# Patient Record
Sex: Male | Born: 1970 | Race: White | Hispanic: No | Marital: Married | Smoking: Never smoker
Health system: Southern US, Community
[De-identification: ages and names within clinical notes are randomized; demographics above are authoritative.]

## PROBLEM LIST (undated history)

## (undated) DIAGNOSIS — B009 Herpesviral infection, unspecified: Secondary | ICD-10-CM

## (undated) DIAGNOSIS — F419 Anxiety disorder, unspecified: Secondary | ICD-10-CM

## (undated) DIAGNOSIS — F32A Depression, unspecified: Secondary | ICD-10-CM

## (undated) DIAGNOSIS — T7840XA Allergy, unspecified, initial encounter: Secondary | ICD-10-CM

## (undated) DIAGNOSIS — F329 Major depressive disorder, single episode, unspecified: Secondary | ICD-10-CM

## (undated) HISTORY — DX: Herpesviral infection, unspecified: B00.9

## (undated) HISTORY — PX: NASAL SINUS SURGERY: SHX719

## (undated) HISTORY — DX: Major depressive disorder, single episode, unspecified: F32.9

## (undated) HISTORY — DX: Allergy, unspecified, initial encounter: T78.40XA

## (undated) HISTORY — DX: Depression, unspecified: F32.A

## (undated) HISTORY — DX: Anxiety disorder, unspecified: F41.9

## (undated) HISTORY — PX: NOSE SURGERY: SHX723

## (undated) HISTORY — PX: HERNIA REPAIR: SHX51

---

## 1998-05-26 HISTORY — PX: VASECTOMY: SHX75

## 1999-09-26 ENCOUNTER — Encounter: Payer: Self-pay | Admitting: Emergency Medicine

## 1999-09-26 ENCOUNTER — Emergency Department (HOSPITAL_COMMUNITY): Admission: EM | Admit: 1999-09-26 | Discharge: 1999-09-26 | Payer: Self-pay | Admitting: Emergency Medicine

## 1999-09-28 ENCOUNTER — Emergency Department (HOSPITAL_COMMUNITY): Admission: EM | Admit: 1999-09-28 | Discharge: 1999-09-28 | Payer: Self-pay | Admitting: Emergency Medicine

## 1999-09-30 ENCOUNTER — Ambulatory Visit (HOSPITAL_COMMUNITY): Admission: RE | Admit: 1999-09-30 | Discharge: 1999-09-30 | Payer: Self-pay | Admitting: Plastic Surgery

## 2011-08-04 ENCOUNTER — Other Ambulatory Visit: Payer: Self-pay | Admitting: Physician Assistant

## 2011-09-02 ENCOUNTER — Other Ambulatory Visit: Payer: Self-pay | Admitting: Physician Assistant

## 2011-09-16 ENCOUNTER — Encounter: Payer: Self-pay | Admitting: Physician Assistant

## 2011-09-16 ENCOUNTER — Ambulatory Visit (INDEPENDENT_AMBULATORY_CARE_PROVIDER_SITE_OTHER): Payer: BC Managed Care – PPO | Admitting: Physician Assistant

## 2011-09-16 VITALS — BP 132/89 | HR 66 | Temp 97.0°F | Resp 18 | Ht 73.0 in | Wt 225.0 lb

## 2011-09-16 DIAGNOSIS — F32A Depression, unspecified: Secondary | ICD-10-CM

## 2011-09-16 DIAGNOSIS — F419 Anxiety disorder, unspecified: Secondary | ICD-10-CM | POA: Insufficient documentation

## 2011-09-16 DIAGNOSIS — Z0000 Encounter for general adult medical examination without abnormal findings: Secondary | ICD-10-CM

## 2011-09-16 DIAGNOSIS — Z125 Encounter for screening for malignant neoplasm of prostate: Secondary | ICD-10-CM

## 2011-09-16 DIAGNOSIS — F329 Major depressive disorder, single episode, unspecified: Secondary | ICD-10-CM | POA: Insufficient documentation

## 2011-09-16 DIAGNOSIS — Z1211 Encounter for screening for malignant neoplasm of colon: Secondary | ICD-10-CM

## 2011-09-16 DIAGNOSIS — F341 Dysthymic disorder: Secondary | ICD-10-CM

## 2011-09-16 DIAGNOSIS — B009 Herpesviral infection, unspecified: Secondary | ICD-10-CM

## 2011-09-16 LAB — CBC WITH DIFFERENTIAL/PLATELET
Basophils Absolute: 0.1 10*3/uL (ref 0.0–0.1)
Basophils Relative: 1 % (ref 0–1)
Eosinophils Absolute: 0.1 10*3/uL (ref 0.0–0.7)
Eosinophils Relative: 2 % (ref 0–5)
HCT: 42.3 % (ref 39.0–52.0)
Hemoglobin: 15.4 g/dL (ref 13.0–17.0)
Lymphocytes Relative: 28 % (ref 12–46)
Lymphs Abs: 2.1 10*3/uL (ref 0.7–4.0)
MCH: 31.2 pg (ref 26.0–34.0)
MCHC: 36.4 g/dL — ABNORMAL HIGH (ref 30.0–36.0)
MCV: 85.8 fL (ref 78.0–100.0)
Monocytes Absolute: 0.5 10*3/uL (ref 0.1–1.0)
Monocytes Relative: 6 % (ref 3–12)
Neutro Abs: 4.8 10*3/uL (ref 1.7–7.7)
Neutrophils Relative %: 63 % (ref 43–77)
Platelets: 164 10*3/uL (ref 150–400)
RBC: 4.93 MIL/uL (ref 4.22–5.81)
RDW: 14.5 % (ref 11.5–15.5)
WBC: 7.6 10*3/uL (ref 4.0–10.5)

## 2011-09-16 LAB — COMPREHENSIVE METABOLIC PANEL
AST: 21 U/L (ref 0–37)
Albumin: 4.8 g/dL (ref 3.5–5.2)
Alkaline Phosphatase: 51 U/L (ref 39–117)
BUN: 13 mg/dL (ref 6–23)
Calcium: 9.4 mg/dL (ref 8.4–10.5)
Chloride: 103 mEq/L (ref 96–112)
Potassium: 4.1 mEq/L (ref 3.5–5.3)
Sodium: 139 mEq/L (ref 135–145)
Total Protein: 7.2 g/dL (ref 6.0–8.3)

## 2011-09-16 LAB — POCT URINALYSIS DIPSTICK
Glucose, UA: NEGATIVE
Leukocytes, UA: NEGATIVE
Nitrite, UA: NEGATIVE
Urobilinogen, UA: 1

## 2011-09-16 LAB — POCT UA - MICROSCOPIC ONLY
Casts, Ur, LPF, POC: NEGATIVE
Crystals, Ur, HPF, POC: NEGATIVE
Yeast, UA: NEGATIVE

## 2011-09-16 LAB — LIPID PANEL
Cholesterol: 129 mg/dL (ref 0–200)
HDL: 32 mg/dL — ABNORMAL LOW (ref >39)
LDL Cholesterol: 75 mg/dL (ref 0–99)
Total CHOL/HDL Ratio: 4 Ratio
Triglycerides: 111 mg/dL (ref <150)
VLDL: 22 mg/dL (ref 0–40)

## 2011-09-16 LAB — TSH: TSH: 3.916 u[IU]/mL (ref 0.350–4.500)

## 2011-09-16 LAB — PSA: PSA: 0.48 ng/mL (ref <=4.00)

## 2011-09-16 LAB — IFOBT (OCCULT BLOOD): IFOBT: NEGATIVE

## 2011-09-16 MED ORDER — VALACYCLOVIR HCL 500 MG PO TABS: 500.0000 mg | ORAL_TABLET | Freq: Every day | ORAL | Status: DC

## 2011-09-16 MED ORDER — CITALOPRAM HYDROBROMIDE 20 MG PO TABS: 20.0000 mg | ORAL_TABLET | Freq: Every day | ORAL | Status: DC

## 2011-09-16 NOTE — Progress Notes (Signed)
Subjective:    Patient ID: Socorro Ebron, male    DOB: 26-Jun-1970, 41 y.o.   MRN: [id number]  HPI  Presents for CPE.  Feels well, without concerns.  Review of Systems  Constitutional: Negative.   HENT: Negative.   Eyes: Negative.   Respiratory: Negative.   Cardiovascular: Negative.   Gastrointestinal: Negative.   Genitourinary: Negative.   Musculoskeletal: Negative.   Skin: Negative.   Neurological: Negative.   Hematological: Negative.   Psychiatric/Behavioral: Negative.        Objective:   Physical Exam  Constitutional: He is oriented to person, place, and time. Vital signs are normal. He appears well-developed and well-nourished.  Non-toxic appearance. He does not have a sickly appearance. He does not appear ill. No distress.  HENT:  Head: Normocephalic and atraumatic. No trismus in the jaw.  Right Ear: Hearing, tympanic membrane, external ear and ear canal normal.  Left Ear: Hearing, tympanic membrane, external ear and ear canal normal.  Nose: Nose normal.  Mouth/Throat: Uvula is midline, oropharynx is clear and moist and mucous membranes are normal. He does not have dentures. No oral lesions. Normal dentition. No dental abscesses, uvula swelling, lacerations or dental caries.  Eyes: Conjunctivae and EOM are normal. Pupils are equal, round, and reactive to light. Right eye exhibits no discharge. Left eye exhibits no discharge. No scleral icterus.  Fundoscopic exam:      The right eye shows no arteriolar narrowing, no AV nicking, no exudate, no hemorrhage and no papilledema. The right eye shows red reflex.The right eye shows no venous pulsations.      The left eye shows no arteriolar narrowing, no AV nicking, no exudate, no hemorrhage and no papilledema. The left eye shows red reflex.The left eye shows no venous pulsations. Neck: Normal range of motion, full passive range of motion without pain and phonation normal. Neck supple. No spinous process tenderness and no muscular  tenderness present. No rigidity. No tracheal deviation, no edema, no erythema and normal range of motion present. No thyromegaly present.  Cardiovascular: Normal rate, regular rhythm, S1 normal, S2 normal, normal heart sounds, intact distal pulses and normal pulses.  Exam reveals no gallop and no friction rub.   No murmur heard. Pulmonary/Chest: Effort normal and breath sounds normal. No respiratory distress. He has no wheezes. He has no rales.  Abdominal: Soft. Normal appearance and bowel sounds are normal. He exhibits no distension and no mass. There is no hepatosplenomegaly. There is no tenderness. There is no rebound and no guarding. No hernia. Hernia confirmed negative in the right inguinal area and confirmed negative in the left inguinal area.  Genitourinary: Prostate normal, testes normal and penis normal. Rectal exam shows external hemorrhoid. Rectal exam shows no internal hemorrhoid, no fissure, no mass, no tenderness and anal tone normal. Guaiac negative stool. Circumcised. No phimosis, paraphimosis, hypospadias, penile erythema or penile tenderness. No discharge found.  Musculoskeletal: Normal range of motion. He exhibits no edema and no tenderness.       Right shoulder: Normal.       Left shoulder: Normal.       Right elbow: Normal.      Left elbow: Normal.       Right wrist: Normal.       Left wrist: Normal.       Right hip: Normal.       Left hip: Normal.       Right knee: Normal.       Left knee: Normal.  Right ankle: Normal. Achilles tendon normal.       Left ankle: Normal. Achilles tendon normal.       Cervical back: Normal. He exhibits normal range of motion, no tenderness, no bony tenderness, no swelling, no edema, no deformity, no laceration, no pain, no spasm and normal pulse.       Thoracic back: Normal.       Lumbar back: Normal.       Right upper arm: Normal.       Left upper arm: Normal.       Right forearm: Normal.       Left forearm: Normal.       Right hand:  Normal.       Left hand: Normal.       Right upper leg: Normal.       Left upper leg: Normal.       Right lower leg: Normal.       Left lower leg: Normal.       Right foot: Normal.       Left foot: Normal.  Lymphadenopathy:       Head (right side): No submental, no submandibular, no tonsillar, no preauricular, no posterior auricular and no occipital adenopathy present.       Head (left side): No submental, no submandibular, no tonsillar, no preauricular, no posterior auricular and no occipital adenopathy present.    He has no cervical adenopathy.       Right: No inguinal and no supraclavicular adenopathy present.       Left: No inguinal and no supraclavicular adenopathy present.  Neurological: He is alert and oriented to person, place, and time. He has normal strength and normal reflexes. He displays no tremor. No cranial nerve deficit. He exhibits normal muscle tone. Coordination and gait normal.  Skin: Skin is warm, dry and intact. No abrasion, no ecchymosis, no laceration, no lesion and no rash noted. He is not diaphoretic. No cyanosis or erythema. No pallor. Nails show no clubbing.  Psychiatric: He has a normal mood and affect. His speech is normal and behavior is normal. Judgment and thought content normal. Cognition and memory are normal.    Results for orders placed in visit on 09/16/11  POCT UA - MICROSCOPIC ONLY      Component Value Range   WBC, Ur, HPF, POC neg     RBC, urine, microscopic 0-1     Bacteria, U Microscopic neg     Mucus, UA neg     Epithelial cells, urine per micros neg     Crystals, Ur, HPF, POC neg     Casts, Ur, LPF, POC neg     Yeast, UA neg    POCT URINALYSIS DIPSTICK      Component Value Range   Color, UA yellow     Clarity, UA clear     Glucose, UA neg     Bilirubin, UA neg     Ketones, UA neg     Spec Grav, UA 1.020     Blood, UA neg     pH, UA 7.5     Protein, UA trace     Urobilinogen, UA 1.0     Nitrite, UA neg     Leukocytes, UA Negative     IFOBT (OCCULT BLOOD)      Component Value Range   IFOBT Negative           Assessment & Plan:   1. Routine general medical examination at a health  care facility  CBC with Differential, TSH, Lipid panel, Comprehensive metabolic panel, POCT UA - Microscopic Only, POCT urinalysis dipstick  2. Anxiety and depression  citalopram (CELEXA) 20 MG tablet  3. HSV-2 infection  valACYclovir (VALTREX) 500 MG tablet  4. Screening for prostate cancer  PSA  5. Screening for colon cancer  IFOBT POC (occult bld, rslt in office)

## 2011-09-16 NOTE — Patient Instructions (Signed)
Keeping you healthy  Get these tests  Blood pressure- Have your blood pressure checked once a year by your healthcare provider.  Normal blood pressure is 120/80.  Weight- Have your body mass index (BMI) calculated to screen for obesity.  BMI is a measure of body fat based on height and weight. You can also calculate your own BMI at [email or web address].west-esparza.com/.  Cholesterol- Have your cholesterol checked regularly starting at age 42, sooner may be necessary if you have diabetes, high blood pressure, if a family member developed heart diseases at an early age or if you smoke.   Chlamydia, HIV, and other sexual transmitted disease- Get screened each year until the age of 29 then within three months of each new sexual partner.  Diabetes- Have your blood sugar checked regularly if you have high blood pressure, high cholesterol, a family history of diabetes or if you are overweight.  Get these vaccines  Flu shot- Every fall.  Tetanus shot- Every 10 years.  Menactra- Single dose; prevents meningitis.  Take these steps  Don't smoke- If you do smoke, ask your healthcare provider about quitting. For tips on how to quit, go to www.smokefree.gov or call 1-800-QUIT-NOW.  Be physically active- Exercise 5 days a week for at least 30 minutes.  If you are not already physically active start slow and gradually work up to 30 minutes of moderate physical activity.  Examples of moderate activity include walking briskly, mowing the yard, dancing, swimming bicycling, etc.  Eat a healthy diet- Eat a variety of healthy foods such as fruits, vegetables, low fat milk, low fat cheese, yogurt, lean meats, poultry, fish, beans, tofu, etc.  For more information on healthy eating, go to www.thenutritionsource.org  Drink alcohol in moderation- Limit alcohol intake two drinks or less a day.  Never drink and drive.  Dentist- Brush and floss teeth twice daily; visit your dentis twice a year.  Depression-Your emotional  health is as important as your physical health.  If you're feeling down, losing interest in things you normally enjoy please talk with your healthcare provider.  Gun Safety- If you keep a gun in your home, keep it unloaded and with the safety lock on.  Bullets should be stored separately.  Helmet use- Always wear a helmet when riding a motorcycle, bicycle, rollerblading or skateboarding.  Safe sex- If you may be exposed to a sexually transmitted infection, use a condom  Seat belts- Seat bels can save your life; always wear one.  Smoke/Carbon Monoxide detectors- These detectors need to be installed on the appropriate level of your home.  Replace batteries at least once a year.  Skin Cancer- When out in the sun, cover up and use sunscreen SPF 15 or higher.  Violence- If anyone is threatening or hurting you, please tell your healthcare provider.  If you have not heard from me about your lab results in 2 weeks, please call.

## 2011-09-17 ENCOUNTER — Encounter: Payer: Self-pay | Admitting: Physician Assistant

## 2012-09-14 ENCOUNTER — Ambulatory Visit (INDEPENDENT_AMBULATORY_CARE_PROVIDER_SITE_OTHER): Payer: BC Managed Care – PPO | Admitting: Physician Assistant

## 2012-09-14 ENCOUNTER — Encounter: Payer: Self-pay | Admitting: Physician Assistant

## 2012-09-14 VITALS — BP 122/84 | HR 67 | Temp 97.8°F | Resp 16 | Ht 72.05 in | Wt 224.0 lb

## 2012-09-14 DIAGNOSIS — F329 Major depressive disorder, single episode, unspecified: Secondary | ICD-10-CM

## 2012-09-14 DIAGNOSIS — Z125 Encounter for screening for malignant neoplasm of prostate: Secondary | ICD-10-CM

## 2012-09-14 DIAGNOSIS — F341 Dysthymic disorder: Secondary | ICD-10-CM

## 2012-09-14 DIAGNOSIS — F32A Depression, unspecified: Secondary | ICD-10-CM

## 2012-09-14 DIAGNOSIS — G2581 Restless legs syndrome: Secondary | ICD-10-CM

## 2012-09-14 DIAGNOSIS — Z0000 Encounter for general adult medical examination without abnormal findings: Secondary | ICD-10-CM

## 2012-09-14 DIAGNOSIS — B009 Herpesviral infection, unspecified: Secondary | ICD-10-CM

## 2012-09-14 DIAGNOSIS — Z1211 Encounter for screening for malignant neoplasm of colon: Secondary | ICD-10-CM

## 2012-09-14 LAB — POCT URINALYSIS DIPSTICK
Glucose, UA: NEGATIVE
Ketones, UA: NEGATIVE
Leukocytes, UA: NEGATIVE
Urobilinogen, UA: 0.2

## 2012-09-14 LAB — CBC WITH DIFFERENTIAL/PLATELET
Basophils Absolute: 0.1 10*3/uL (ref 0.0–0.1)
Basophils Relative: 1 % (ref 0–1)
HCT: 42.4 % (ref 39.0–52.0)
MCHC: 35.6 g/dL (ref 30.0–36.0)
Monocytes Absolute: 0.5 10*3/uL (ref 0.1–1.0)
Neutro Abs: 4.5 10*3/uL (ref 1.7–7.7)
Platelets: 153 10*3/uL (ref 150–400)
RDW: 14.3 % (ref 11.5–15.5)
WBC: 7.4 10*3/uL (ref 4.0–10.5)

## 2012-09-14 LAB — COMPREHENSIVE METABOLIC PANEL
ALT: 44 U/L (ref 0–53)
AST: 31 U/L (ref 0–37)
Albumin: 4.4 g/dL (ref 3.5–5.2)
Calcium: 9.3 mg/dL (ref 8.4–10.5)
Chloride: 102 mEq/L (ref 96–112)
Potassium: 3.8 mEq/L (ref 3.5–5.3)
Sodium: 140 mEq/L (ref 135–145)
Total Protein: 7.1 g/dL (ref 6.0–8.3)

## 2012-09-14 LAB — POCT UA - MICROSCOPIC ONLY
Casts, Ur, LPF, POC: NEGATIVE
Crystals, Ur, HPF, POC: NEGATIVE

## 2012-09-14 LAB — PSA: PSA: 0.54 ng/mL (ref <=4.00)

## 2012-09-14 MED ORDER — VALACYCLOVIR HCL 500 MG PO TABS: 500.0000 mg | ORAL_TABLET | Freq: Every day | ORAL | Status: DC

## 2012-09-14 MED ORDER — CITALOPRAM HYDROBROMIDE 20 MG PO TABS: 20.0000 mg | ORAL_TABLET | Freq: Every day | ORAL | Status: DC

## 2012-09-14 MED ORDER — ROPINIROLE HCL 0.5 MG PO TABS: ORAL_TABLET | ORAL | Status: DC

## 2012-09-14 NOTE — Progress Notes (Deleted)
  Subjective:    Patient ID: Kevin West, male    DOB: 28-Jan-1971, 43 y.o.   MRN: [id number]  HPI    Review of Systems  Constitutional: Negative.   HENT: Negative.   Eyes: Negative.   Respiratory: Negative.   Cardiovascular: Negative.   Gastrointestinal: Negative.   Endocrine: Negative.   Genitourinary: Negative.   Musculoskeletal: Negative.   Skin: Negative.   Allergic/Immunologic: Negative.   Neurological: Negative.   Hematological: Negative.   Psychiatric/Behavioral: Negative.        Objective:   Physical Exam        Assessment & Plan:

## 2012-09-14 NOTE — Progress Notes (Signed)
Subjective:    Patient ID: Kevin West, male    DOB: February 21, 1971, 42 y.o.   MRN: [id number]  HPI  This 42 y.o. male presents for Annual Wellness Exam.   Past Medical History  Diagnosis Date  . Depression   . Anxiety   . HSV-2 infection   . Allergy     Past Surgical History  Procedure Laterality Date  . Hernia repair    . Vasectomy  2000  . Nose surgery      fracture  . Nasal sinus surgery      Prior to Admission medications   Medication Sig Start Date End Date Taking? Authorizing Provider  citalopram (CELEXA) 20 MG tablet Take 1 tablet (20 mg total) by mouth daily. 09/14/12  Yes [name] S [name], PA-C  valACYclovir (VALTREX) 500 MG tablet Take 1 tablet (500 mg total) by mouth daily. 09/14/12  Yes [name] S [name], PA-C    No Known Allergies  History   Social History  . Marital Status: Married    Spouse Name: N/A    Number of Children: 2  . Years of Education: 12   Occupational History  . construction/contractor   . baseball coach    Social History Main Topics  . Smoking status: Never Smoker   . Smokeless tobacco: Not on file  . Alcohol Use: No  . Drug Use: No  . Sexually Active: Yes -- Male partner(s)   Other Topics Concern  . Not on file   Social History Narrative   Lives with his wife (married 07/22/1990) and their 2 sons. Does not exercise.      Family History  Problem Relation Age of Onset  . Hypertension Mother   . Stroke Mother 2  . Cancer Sister   . Cancer Maternal Grandfather   . Cancer Sister     skin     Review of Systems  Constitutional: Negative.        Work has been very busy for the past 5-6 months.  Has recently cut back on his evening hours, but has been working 18-hour days.  HENT: Negative.   Eyes: Negative.   Respiratory: Negative.   Cardiovascular: Negative.   Gastrointestinal: Negative.   Endocrine: Negative.   Genitourinary: Negative.   Skin: Negative.   Allergic/Immunologic: Negative.   Neurological:       Leg  pain and restlessness at night x 6-8 months.  If he tucks his legs under his wife's legs, it seems to help.  Hematological: Negative.   Psychiatric/Behavioral: Negative.        Objective:   Physical Exam  Vitals reviewed. Constitutional: He is oriented to person, place, and time. Vital signs are normal. He appears well-developed and well-nourished. He is active and cooperative. No distress.  HENT:  Head: Normocephalic and atraumatic.  Right Ear: Hearing, tympanic membrane, external ear and ear canal normal.  Left Ear: Hearing, tympanic membrane, external ear and ear canal normal.  Nose: Nose normal.  Mouth/Throat: Uvula is midline, oropharynx is clear and moist and mucous membranes are normal. Normal dentition. No oropharyngeal exudate.  Eyes: Conjunctivae, EOM and lids are normal. Pupils are equal, round, and reactive to light. Right eye exhibits no discharge. Left eye exhibits no discharge. No scleral icterus.  Fundoscopic exam:      The right eye shows no arteriolar narrowing, no AV nicking, no exudate, no hemorrhage and no papilledema. The right eye shows red reflex.       The left eye shows no  arteriolar narrowing, no AV nicking, no exudate, no hemorrhage and no papilledema. The left eye shows red reflex.  Neck: Normal range of motion and full passive range of motion without pain. Neck supple. No thyromegaly present.  Cardiovascular: Normal rate, regular rhythm, normal heart sounds and intact distal pulses.   Pulmonary/Chest: Effort normal and breath sounds normal.  Abdominal: Soft. Normal appearance and bowel sounds are normal. There is no tenderness. There is no CVA tenderness. No hernia. Hernia confirmed negative in the right inguinal area and confirmed negative in the left inguinal area.  Genitourinary: Rectum normal, prostate normal, testes normal and penis normal. Circumcised.  Musculoskeletal: Normal range of motion.       Right ankle: Normal. Achilles tendon normal.       Left  ankle: Normal.       Cervical back: Normal.       Thoracic back: Normal.       Lumbar back: Normal.       Right upper leg: Normal.       Left upper leg: Normal.       Right lower leg: Normal.       Left lower leg: Normal.       Right foot: Normal.       Left foot: Normal.  Lymphadenopathy:    He has no cervical adenopathy.       Right: No inguinal and no supraclavicular adenopathy present.       Left: No inguinal and no supraclavicular adenopathy present.  Neurological: He is alert and oriented to person, place, and time. He has normal strength and normal reflexes. No cranial nerve deficit or sensory deficit. Coordination normal.  Skin: Skin is warm, dry and intact. No rash noted. He is not diaphoretic.  Psychiatric: He has a normal mood and affect. His speech is normal and behavior is normal. Judgment and thought content normal.   Results for orders placed in visit on 09/14/12  POCT UA - MICROSCOPIC ONLY      Result Value Range   WBC, Ur, HPF, POC neg     RBC, urine, microscopic 5-10     Bacteria, U Microscopic neg     Mucus, UA neg     Epithelial cells, urine per micros neg     Crystals, Ur, HPF, POC neg     Casts, Ur, LPF, POC neg     Yeast, UA neg    POCT URINALYSIS DIPSTICK      Result Value Range   Color, UA orange     Clarity, UA clear     Glucose, UA neg     Bilirubin, UA neg     Ketones, UA neg     Spec Grav, UA >=1.030     Blood, UA trace-lysed     pH, UA 6.0     Protein, UA trace     Urobilinogen, UA 0.2     Nitrite, UA neg     Leukocytes, UA Negative    IFOBT (OCCULT BLOOD)      Result Value Range   IFOBT Negative          Assessment & Plan:  Routine general medical examination at a health care facility - Plan: CBC with Differential, POCT UA - Microscopic Only, POCT urinalysis dipstick, Lipid panel, Comprehensive metabolic panel, TSH; Age appropriate anticipatory guidance provided.  Screening for prostate cancer - Plan: PSA  Screening for colon  cancer - Plan: IFOBT POC (occult bld, rslt in office)  Anxiety and  depression - Plan: citalopram (CELEXA) 20 MG tablet  HSV-2 infection - Plan: valACYclovir (VALTREX) 500 MG tablet  Restless legs - Plan: rOPINIRole (REQUIP) 0.5 MG tablet  Fernande Bras, PA-C Physician Assistant-Certified [facility]

## 2012-09-14 NOTE — Patient Instructions (Addendum)
For Restless Legs: Start the requip by taking 1/2 tab daily for days 1&2, then 1 tab daily days 3-7.  You may increase by 1 tablet each week, up to a maximum for 3 mg (6 tabs) daily.  Contact me with the dose that resolves your symptoms, and I'll re-write your prescription for that dose.  I will contact you with your lab results as soon as they are available.  If you have not heard from me in 2 weeks, please contact me.  The fastest way to get your results is to register for My Chart (see the instructions on the last page of this printout).   Keeping you healthy  Get these tests  Blood pressure- Have your blood pressure checked once a year by your healthcare provider.  Normal blood pressure is 120/80.  Weight- Have your body mass index (BMI) calculated to screen for obesity.  BMI is a measure of body fat based on height and weight. You can also calculate your own BMI at https://www.west-esparza.com/.  Cholesterol- Have your cholesterol checked regularly starting at age 63, sooner may be necessary if you have diabetes, high blood pressure, if a family member developed heart diseases at an early age or if you smoke.   Chlamydia, HIV, and other sexual transmitted disease- Get screened each year until the age of 48 then within three months of each new sexual partner.  Diabetes- Have your blood sugar checked regularly if you have high blood pressure, high cholesterol, a family history of diabetes or if you are overweight.  Get these vaccines  Flu shot- Every fall.  Tetanus shot- Every 10 years.  Menactra- Single dose; prevents meningitis.  Take these steps  Don't smoke- If you do smoke, ask your healthcare provider about quitting. For tips on how to quit, go to www.smokefree.gov or call 1-800-QUIT-NOW.  Be physically active- Exercise 5 days a week for at least 30 minutes.  If you are not already physically active start slow and gradually work up to 30 minutes of moderate physical activity.   Examples of moderate activity include walking briskly, mowing the yard, dancing, swimming bicycling, etc.  Eat a healthy diet- Eat a variety of healthy foods such as fruits, vegetables, low fat milk, low fat cheese, yogurt, lean meats, poultry, fish, beans, tofu, etc.  For more information on healthy eating, go to www.thenutritionsource.org  Drink alcohol in moderation- Limit alcohol intake two drinks or less a day.  Never drink and drive.  Dentist- Brush and floss teeth twice daily; visit your dentis twice a year.  Depression-Your emotional health is as important as your physical health.  If you're feeling down, losing interest in things you normally enjoy please talk with your healthcare provider.  Gun Safety- If you keep a gun in your home, keep it unloaded and with the safety lock on.  Bullets should be stored separately.  Helmet use- Always wear a helmet when riding a motorcycle, bicycle, rollerblading or skateboarding.  Safe sex- If you may be exposed to a sexually transmitted infection, use a condom  Seat belts- Seat bels can save your life; always wear one.  Smoke/Carbon Monoxide detectors- These detectors need to be installed on the appropriate level of your home.  Replace batteries at least once a year.  Skin Cancer- When out in the sun, cover up and use sunscreen SPF 15 or higher.  Violence- If anyone is threatening or hurting you, please tell your healthcare provider.

## 2012-09-15 ENCOUNTER — Encounter: Payer: Self-pay | Admitting: Physician Assistant

## 2013-08-24 ENCOUNTER — Other Ambulatory Visit: Payer: Self-pay | Admitting: Physician Assistant

## 2013-09-27 ENCOUNTER — Encounter: Payer: BC Managed Care – PPO | Admitting: Physician Assistant

## 2013-09-29 ENCOUNTER — Ambulatory Visit (INDEPENDENT_AMBULATORY_CARE_PROVIDER_SITE_OTHER): Payer: BC Managed Care – PPO | Admitting: Physician Assistant

## 2013-09-29 ENCOUNTER — Encounter: Payer: Self-pay | Admitting: Physician Assistant

## 2013-09-29 VITALS — BP 127/82 | HR 77 | Temp 97.7°F | Resp 16 | Ht 72.5 in | Wt 231.0 lb

## 2013-09-29 DIAGNOSIS — F329 Major depressive disorder, single episode, unspecified: Secondary | ICD-10-CM

## 2013-09-29 DIAGNOSIS — L03319 Cellulitis of trunk, unspecified: Secondary | ICD-10-CM

## 2013-09-29 DIAGNOSIS — L02219 Cutaneous abscess of trunk, unspecified: Secondary | ICD-10-CM

## 2013-09-29 DIAGNOSIS — F32A Depression, unspecified: Secondary | ICD-10-CM

## 2013-09-29 DIAGNOSIS — F419 Anxiety disorder, unspecified: Secondary | ICD-10-CM

## 2013-09-29 DIAGNOSIS — Z125 Encounter for screening for malignant neoplasm of prostate: Secondary | ICD-10-CM

## 2013-09-29 DIAGNOSIS — F341 Dysthymic disorder: Secondary | ICD-10-CM

## 2013-09-29 DIAGNOSIS — Z0000 Encounter for general adult medical examination without abnormal findings: Secondary | ICD-10-CM

## 2013-09-29 DIAGNOSIS — B009 Herpesviral infection, unspecified: Secondary | ICD-10-CM

## 2013-09-29 DIAGNOSIS — G2581 Restless legs syndrome: Secondary | ICD-10-CM

## 2013-09-29 LAB — CBC WITH DIFFERENTIAL/PLATELET
BASOS ABS: 0.1 10*3/uL (ref 0.0–0.1)
BASOS PCT: 1 % (ref 0–1)
Eosinophils Absolute: 0.1 10*3/uL (ref 0.0–0.7)
Eosinophils Relative: 2 % (ref 0–5)
HEMATOCRIT: 41.9 % (ref 39.0–52.0)
HEMOGLOBIN: 15 g/dL (ref 13.0–17.0)
LYMPHS PCT: 27 % (ref 12–46)
Lymphs Abs: 1.9 10*3/uL (ref 0.7–4.0)
MCH: 31.7 pg (ref 26.0–34.0)
MCHC: 35.8 g/dL (ref 30.0–36.0)
MCV: 88.6 fL (ref 78.0–100.0)
MONO ABS: 0.4 10*3/uL (ref 0.1–1.0)
MONOS PCT: 6 % (ref 3–12)
NEUTROS ABS: 4.4 10*3/uL (ref 1.7–7.7)
NEUTROS PCT: 64 % (ref 43–77)
Platelets: 163 10*3/uL (ref 150–400)
RBC: 4.73 MIL/uL (ref 4.22–5.81)
RDW: 14.5 % (ref 11.5–15.5)
WBC: 6.9 10*3/uL (ref 4.0–10.5)

## 2013-09-29 LAB — POCT UA - MICROSCOPIC ONLY
Bacteria, U Microscopic: NEGATIVE
CASTS, UR, LPF, POC: NEGATIVE
CRYSTALS, UR, HPF, POC: NEGATIVE
EPITHELIAL CELLS, URINE PER MICROSCOPY: NEGATIVE
Mucus, UA: NEGATIVE
RBC, URINE, MICROSCOPIC: NEGATIVE
WBC, Ur, HPF, POC: NEGATIVE
Yeast, UA: NEGATIVE

## 2013-09-29 LAB — POCT URINALYSIS DIPSTICK
Bilirubin, UA: NEGATIVE
GLUCOSE UA: NEGATIVE
Ketones, UA: NEGATIVE
Leukocytes, UA: NEGATIVE
NITRITE UA: NEGATIVE
PH UA: 7
PROTEIN UA: NEGATIVE
RBC UA: NEGATIVE
SPEC GRAV UA: 1.02
UROBILINOGEN UA: 1

## 2013-09-29 LAB — COMPLETE METABOLIC PANEL WITH GFR
ALT: 33 U/L (ref 0–53)
AST: 26 U/L (ref 0–37)
Albumin: 4.4 g/dL (ref 3.5–5.2)
Alkaline Phosphatase: 48 U/L (ref 39–117)
BUN: 12 mg/dL (ref 6–23)
CALCIUM: 9.2 mg/dL (ref 8.4–10.5)
CHLORIDE: 102 meq/L (ref 96–112)
CO2: 29 meq/L (ref 19–32)
Creat: 1.21 mg/dL (ref 0.50–1.35)
GFR, EST AFRICAN AMERICAN: 84 mL/min
GFR, Est Non African American: 73 mL/min
Glucose, Bld: 91 mg/dL (ref 70–99)
POTASSIUM: 4.1 meq/L (ref 3.5–5.3)
SODIUM: 138 meq/L (ref 135–145)
TOTAL PROTEIN: 7.1 g/dL (ref 6.0–8.3)
Total Bilirubin: 1.3 mg/dL — ABNORMAL HIGH (ref 0.2–1.2)

## 2013-09-29 LAB — LIPID PANEL
CHOLESTEROL: 135 mg/dL (ref 0–200)
HDL: 30 mg/dL — ABNORMAL LOW (ref >39)
LDL Cholesterol: 67 mg/dL (ref 0–99)
TRIGLYCERIDES: 192 mg/dL — AB (ref <150)
Total CHOL/HDL Ratio: 4.5 Ratio
VLDL: 38 mg/dL (ref 0–40)

## 2013-09-29 MED ORDER — ROPINIROLE HCL 0.5 MG PO TABS: ORAL_TABLET | ORAL | Status: DC

## 2013-09-29 MED ORDER — CITALOPRAM HYDROBROMIDE 20 MG PO TABS: ORAL_TABLET | ORAL | Status: DC

## 2013-09-29 MED ORDER — VALACYCLOVIR HCL 500 MG PO TABS: ORAL_TABLET | ORAL | Status: DC

## 2013-09-29 MED ORDER — DOXYCYCLINE HYCLATE 100 MG PO CAPS: 100.0000 mg | ORAL_CAPSULE | Freq: Two times a day (BID) | ORAL | Status: DC

## 2013-09-29 NOTE — Patient Instructions (Addendum)
I will contact you with your lab results as soon as they are available.   If you have not heard from me in 2 weeks, please contact me.  The fastest way to get your results is to register for My Chart (see the instructions on the last page of this printout).  Keeping you healthy  Get these tests  Blood pressure- Have your blood pressure checked once a year by your healthcare provider.  Normal blood pressure is 120/80.  Weight- Have your body mass index (BMI) calculated to screen for obesity.  BMI is a measure of body fat based on height and weight. You can also calculate your own BMI at www.nhlbisupport.com/bmi/.  Cholesterol- Have your cholesterol checked regularly starting at age 35, sooner may be necessary if you have diabetes, high blood pressure, if a family member developed heart diseases at an early age or if you smoke.   Chlamydia, HIV, and other sexual transmitted disease- Get screened each year until the age of 25 then within three months of each new sexual partner.  Diabetes- Have your blood sugar checked regularly if you have high blood pressure, high cholesterol, a family history of diabetes or if you are overweight.  Get these vaccines  Flu shot- Every fall.  Tetanus shot- Every 10 years.  Menactra- Single dose; prevents meningitis.  Take these steps  Don't smoke- If you do smoke, ask your healthcare provider about quitting. For tips on how to quit, go to www.smokefree.gov or call 1-800-QUIT-NOW.  Be physically active- Exercise 5 days a week for at least 30 minutes.  If you are not already physically active start slow and gradually work up to 30 minutes of moderate physical activity.  Examples of moderate activity include walking briskly, mowing the yard, dancing, swimming bicycling, etc.  Eat a healthy diet- Eat a variety of healthy foods such as fruits, vegetables, low fat milk, low fat cheese, yogurt, lean meats, poultry, fish, beans, tofu, etc.  For more information  on healthy eating, go to www.thenutritionsource.org  Drink alcohol in moderation- Limit alcohol intake two drinks or less a day.  Never drink and drive.  Dentist- Brush and floss teeth twice daily; visit your dentis twice a year.  Depression-Your emotional health is as important as your physical health.  If you're feeling down, losing interest in things you normally enjoy please talk with your healthcare provider.  Gun Safety- If you keep a gun in your home, keep it unloaded and with the safety lock on.  Bullets should be stored separately.  Helmet use- Always wear a helmet when riding a motorcycle, bicycle, rollerblading or skateboarding.  Safe sex- If you may be exposed to a sexually transmitted infection, use a condom  Seat belts- Seat bels can save your life; always wear one.  Smoke/Carbon Monoxide detectors- These detectors need to be installed on the appropriate level of your home.  Replace batteries at least once a year.  Skin Cancer- When out in the sun, cover up and use sunscreen SPF 15 or higher.  Violence- If anyone is threatening or hurting you, please tell your healthcare provider. 

## 2013-09-29 NOTE — Progress Notes (Signed)
Subjective:    Patient ID: Kevin HaberDavid West, male    DOB: [birth year], 43 y.o.   MRN: [id number]  HPI  Patient Active Problem List   Diagnosis Date Noted  . 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 Anxiety and depression 09/16/2011   Prior to Admission medications   Medication Sig Start Date End Date Taking? Authorizing Provider  citalopram (CELEXA) 20 MG tablet TAKE ONE TABLET BY MOUTH ONCE DAILY 09/29/13  Yes Chelle S Jeffery, PA-C  valACYclovir (VALTREX) 500 MG tablet TAKE ONE TABLET BY MOUTH ONCE DAILY 09/29/13  Yes Chelle S Jeffery, PA-C  rOPINIRole (REQUIP) 0.5 MG tablet Take 1 tablet 2-3 hours before bedtime. 09/29/13  NO Chelle Tessa LernerS Jeffery, PA-C     43y.o male presents for annual physical exam and medication refill of citalopram and valacyclovir.  Pt also still experiencing pain in lower legs after sitting down after normal activity.  Prescribed ropinirole for probable RLS at last years annual physical, but medication was never picked up.  Pt has asked to have medication reordered due to persistence of symptoms.  Pt also with painful lesion on L mid back.  Pt has his wife shave his back.  Pt describes the lesion growing in size over the past few weeks.  He has tried used neosporin and warm compresses.  Pt describes frank purulence draining from the lesion a few days ago.  Denies fever, N/V/D.     Review of Systems  Constitutional: Negative for fever, chills, appetite change and fatigue.  HENT: Negative.   Eyes: Negative.   Respiratory: Negative for cough, chest tightness, shortness of breath and wheezing.   Cardiovascular: Negative for chest pain, palpitations and leg swelling.  Gastrointestinal: Negative for nausea, vomiting, abdominal pain, diarrhea and abdominal distention.  Endocrine: Negative.   Genitourinary: Negative.   Musculoskeletal: Negative for arthralgias and myalgias.  Skin: Negative for color change and rash.  Allergic/Immunologic: Negative.   Neurological: Negative for  dizziness, weakness, light-headedness, numbness and headaches.  Hematological: Negative.        Objective:   Physical Exam  Constitutional: He is oriented to person, place, and time. He appears well-developed and well-nourished. No distress.  BP 127/82  Pulse 77  Temp(Src) 97.7 F (36.5 C)  Resp 16  Ht 6' 0.5" (1.842 m)  Wt 231 lb (104.781 kg)  BMI 30.88 kg/m2  SpO2 96%   HENT:  Head: Normocephalic.  Right Ear: Tympanic membrane and ear canal normal.  Left Ear: Tympanic membrane and ear canal normal.  Nose: Nose normal.  Mouth/Throat: Uvula is midline and oropharynx is clear and moist.  Eyes: Conjunctivae are normal. Pupils are equal, round, and reactive to light.  Neck: Normal range of motion.  Cardiovascular: Normal rate, regular rhythm, normal heart sounds and intact distal pulses.  Exam reveals no gallop and no friction rub.   No murmur heard. Pulmonary/Chest: Effort normal and breath sounds normal. No respiratory distress. He has no wheezes. He exhibits no tenderness.  Abdominal: Soft. Bowel sounds are normal. He exhibits no distension and no mass. There is no tenderness. Hernia confirmed negative in the right inguinal area and confirmed negative in the left inguinal area.  Genitourinary: Testes normal and penis normal. Right testis shows no mass and no tenderness. Left testis shows no mass and no tenderness. Circumcised. No penile tenderness. No discharge found.  Musculoskeletal: Normal range of motion. He exhibits no edema.  Lymphadenopathy:    He has no cervical adenopathy.       Right: No  inguinal adenopathy present.       Left: No inguinal adenopathy present.  Neurological: He is alert and oriented to person, place, and time. He has normal reflexes. He displays normal reflexes. No cranial nerve deficit. Coordination normal.  Skin: Skin is warm and dry.     Psychiatric: He has a normal mood and affect. His behavior is normal.    Results for orders placed in visit  on 09/29/13  POCT URINALYSIS DIPSTICK      Result Value Ref Range   Color, UA yellow     Clarity, UA clear     Glucose, UA neg     Bilirubin, UA neg     Ketones, UA neg     Spec Grav, UA 1.020     Blood, UA neg     pH, UA 7.0     Protein, UA neg     Urobilinogen, UA 1.0     Nitrite, UA neg     Leukocytes, UA Negative    POCT UA - MICROSCOPIC ONLY      Result Value Ref Range   WBC, Ur, HPF, POC neg     RBC, urine, microscopic neg     Bacteria, U Microscopic neg     Mucus, UA neg     Epithelial cells, urine per micros neg     Crystals, Ur, HPF, POC neg     Casts, Ur, LPF, POC neg     Yeast, UA neg           Assessment & Plan:   1. Annual physical exam - Lipid panel - POCT urinalysis dipstick - POCT UA - Microscopic Only  2. Cellulitis and abscess of trunk Eschar removed and contents were cultured.  Awaiting results. - CBC with Differential - Wound culture - doxycycline (VIBRAMYCIN) 100 MG capsule; Take 1 capsule (100 mg total) by mouth 2 (two) times daily.  Dispense: 20 capsule; Refill: 0  3. Restless legs Pt will try ropinirole for probable RLS - COMPLETE METABOLIC PANEL WITH GFR - TSH - rOPINIRole (REQUIP) 0.5 MG tablet; Take 1 tablet 2-3 hours before bedtime.  Dispense: 90 tablet; Refill: 3  4. HSV-2 infection Med refilled. - valACYclovir (VALTREX) 500 MG tablet; TAKE ONE TABLET BY MOUTH ONCE DAILY  Dispense: 90 tablet; Refill: 3  5. Anxiety and depression Med refilled - citalopram (CELEXA) 20 MG tablet; TAKE ONE TABLET BY MOUTH ONCE DAILY  Dispense: 90 tablet; Refill: 3  6. Screening for prostate cancer - PSA

## 2013-09-29 NOTE — Progress Notes (Signed)
I have examined this patient along with the student and agree.  

## 2013-09-30 LAB — TSH: TSH: 2.588 u[IU]/mL (ref 0.350–4.500)

## 2013-09-30 LAB — PSA: PSA: 0.53 ng/mL (ref <=4.00)

## 2013-10-01 ENCOUNTER — Encounter: Payer: Self-pay | Admitting: Physician Assistant

## 2013-10-01 LAB — WOUND CULTURE
GRAM STAIN: NONE SEEN
Gram Stain: NONE SEEN

## 2013-10-11 ENCOUNTER — Encounter: Payer: Self-pay | Admitting: Physician Assistant

## 2013-10-11 DIAGNOSIS — R0981 Nasal congestion: Secondary | ICD-10-CM | POA: Insufficient documentation

## 2014-08-04 ENCOUNTER — Other Ambulatory Visit: Payer: Self-pay | Admitting: Physician Assistant

## 2014-09-05 ENCOUNTER — Other Ambulatory Visit: Payer: Self-pay | Admitting: Physician Assistant

## 2014-09-28 ENCOUNTER — Telehealth: Payer: Self-pay | Admitting: Family Medicine

## 2014-09-28 MED ORDER — VALACYCLOVIR HCL 500 MG PO TABS: 500.0000 mg | ORAL_TABLET | Freq: Every day | ORAL | Status: DC

## 2014-09-28 NOTE — Telephone Encounter (Signed)
Meds ordered this encounter  Medications  . valACYclovir (VALTREX) 500 MG tablet    Sig: Take 1 tablet (500 mg total) by mouth daily.    Dispense:  30 tablet    Refill:  1    Order Specific Question:  Supervising Provider    Answer:  DOOLITTLE, ROBERT P [[id number]]

## 2014-09-28 NOTE — Telephone Encounter (Signed)
Pt notified that rx called in. 

## 2014-09-28 NOTE — Telephone Encounter (Signed)
Called to reschedule patients appt to 11/23/14 @10 :00 he states that he needs a refill on is VALTREX until his appt

## 2014-09-29 ENCOUNTER — Other Ambulatory Visit: Payer: Self-pay | Admitting: Physician Assistant

## 2014-10-03 ENCOUNTER — Encounter: Payer: BC Managed Care – PPO | Admitting: Physician Assistant

## 2014-11-02 ENCOUNTER — Other Ambulatory Visit: Payer: Self-pay | Admitting: Physician Assistant

## 2014-11-23 ENCOUNTER — Ambulatory Visit (INDEPENDENT_AMBULATORY_CARE_PROVIDER_SITE_OTHER): Payer: BLUE CROSS/BLUE SHIELD | Admitting: Physician Assistant

## 2014-11-23 ENCOUNTER — Encounter: Payer: Self-pay | Admitting: Physician Assistant

## 2014-11-23 VITALS — BP 118/84 | HR 74 | Temp 97.8°F | Resp 16 | Ht 73.25 in | Wt 228.0 lb

## 2014-11-23 DIAGNOSIS — F418 Other specified anxiety disorders: Secondary | ICD-10-CM

## 2014-11-23 DIAGNOSIS — Z0000 Encounter for general adult medical examination without abnormal findings: Secondary | ICD-10-CM

## 2014-11-23 DIAGNOSIS — Z13228 Encounter for screening for other metabolic disorders: Secondary | ICD-10-CM | POA: Diagnosis not present

## 2014-11-23 DIAGNOSIS — Z114 Encounter for screening for human immunodeficiency virus [HIV]: Secondary | ICD-10-CM

## 2014-11-23 DIAGNOSIS — Z130 Encounter for screening for diseases of the blood and blood-forming organs and certain disorders involving the immune mechanism: Secondary | ICD-10-CM

## 2014-11-23 DIAGNOSIS — B009 Herpesviral infection, unspecified: Secondary | ICD-10-CM

## 2014-11-23 DIAGNOSIS — D696 Thrombocytopenia, unspecified: Secondary | ICD-10-CM

## 2014-11-23 DIAGNOSIS — Z1329 Encounter for screening for other suspected endocrine disorder: Secondary | ICD-10-CM | POA: Diagnosis not present

## 2014-11-23 DIAGNOSIS — F419 Anxiety disorder, unspecified: Secondary | ICD-10-CM

## 2014-11-23 DIAGNOSIS — Z125 Encounter for screening for malignant neoplasm of prostate: Secondary | ICD-10-CM | POA: Diagnosis not present

## 2014-11-23 DIAGNOSIS — F32A Depression, unspecified: Secondary | ICD-10-CM

## 2014-11-23 DIAGNOSIS — F329 Major depressive disorder, single episode, unspecified: Secondary | ICD-10-CM

## 2014-11-23 DIAGNOSIS — Z13220 Encounter for screening for lipoid disorders: Secondary | ICD-10-CM | POA: Diagnosis not present

## 2014-11-23 DIAGNOSIS — R05 Cough: Secondary | ICD-10-CM

## 2014-11-23 DIAGNOSIS — R059 Cough, unspecified: Secondary | ICD-10-CM

## 2014-11-23 LAB — COMPREHENSIVE METABOLIC PANEL
ALK PHOS: 44 U/L (ref 39–117)
ALT: 24 U/L (ref 0–53)
AST: 19 U/L (ref 0–37)
Albumin: 4.3 g/dL (ref 3.5–5.2)
BILIRUBIN TOTAL: 1.1 mg/dL (ref 0.2–1.2)
BUN: 9 mg/dL (ref 6–23)
CALCIUM: 9 mg/dL (ref 8.4–10.5)
CO2: 30 mEq/L (ref 19–32)
Chloride: 103 mEq/L (ref 96–112)
Creat: 1.2 mg/dL (ref 0.50–1.35)
Glucose, Bld: 84 mg/dL (ref 70–99)
Potassium: 4.2 mEq/L (ref 3.5–5.3)
Sodium: 140 mEq/L (ref 135–145)
Total Protein: 7 g/dL (ref 6.0–8.3)

## 2014-11-23 LAB — CBC WITH DIFFERENTIAL/PLATELET
Basophils Absolute: 0.1 10*3/uL (ref 0.0–0.1)
Basophils Relative: 1 % (ref 0–1)
Eosinophils Absolute: 0.2 10*3/uL (ref 0.0–0.7)
Eosinophils Relative: 3 % (ref 0–5)
HEMATOCRIT: 41.6 % (ref 39.0–52.0)
HEMOGLOBIN: 14.8 g/dL (ref 13.0–17.0)
LYMPHS ABS: 2 10*3/uL (ref 0.7–4.0)
Lymphocytes Relative: 28 % (ref 12–46)
MCH: 32.7 pg (ref 26.0–34.0)
MCHC: 35.6 g/dL (ref 30.0–36.0)
MCV: 91.8 fL (ref 78.0–100.0)
MONO ABS: 0.4 10*3/uL (ref 0.1–1.0)
MPV: 10 fL (ref 8.6–12.4)
Monocytes Relative: 6 % (ref 3–12)
NEUTROS ABS: 4.4 10*3/uL (ref 1.7–7.7)
Neutrophils Relative %: 62 % (ref 43–77)
PLATELETS: 138 10*3/uL — AB (ref 150–400)
RBC: 4.53 MIL/uL (ref 4.22–5.81)
RDW: 15 % (ref 11.5–15.5)
WBC: 7.1 10*3/uL (ref 4.0–10.5)

## 2014-11-23 LAB — LIPID PANEL
CHOL/HDL RATIO: 4.2 ratio
Cholesterol: 109 mg/dL (ref 0–200)
HDL: 26 mg/dL — ABNORMAL LOW (ref >=40)
LDL CALC: 51 mg/dL (ref 0–99)
TRIGLYCERIDES: 161 mg/dL — AB (ref <150)
VLDL: 32 mg/dL (ref 0–40)

## 2014-11-23 LAB — TSH: TSH: 3.536 u[IU]/mL (ref 0.350–4.500)

## 2014-11-23 MED ORDER — VALACYCLOVIR HCL 500 MG PO TABS: 500.0000 mg | ORAL_TABLET | Freq: Every day | ORAL | Status: DC

## 2014-11-23 MED ORDER — CITALOPRAM HYDROBROMIDE 20 MG PO TABS: 20.0000 mg | ORAL_TABLET | Freq: Every day | ORAL | Status: DC

## 2014-11-23 MED ORDER — ALBUTEROL SULFATE HFA 108 (90 BASE) MCG/ACT IN AERS: 2.0000 | INHALATION_SPRAY | RESPIRATORY_TRACT | Status: DC | PRN

## 2014-11-23 NOTE — Progress Notes (Signed)
Subjective:    Patient ID: Kevin West, male    DOB: [birth year], 44 y.o.   MRN: [id number]  PCP: Irem Stoneham, PA-C  Chief Complaint  Patient presents with  . Annual Exam    HPI  Presents for annual exam. Generally he is doing well. Only issue is the stress at home. Current marital stress. "I let her quit work about 6 years ago. She's cranky. Any little thing will set her off. The kids get it the worst. She's a good lady, a wonderful lady. You can't pick, play or goof off because she takes it really personally. Seems to have spiraled down about 3 years ago when her mother died." Perhaps feel like her father and siblings hold it against her that she was the one who made the decisions around her mother's death.  He's been begging her to seek treatment for several years. "She knows that I love her, and I know that she loves me. 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 She shouldn't be stressed, she doesn't have all that much to do."  She moved out on his birthday, into another property that they own. She has been to see her PCP and has started medication. He's already noticed a difference in her mood. They are "dating" each other, but he has not allowed her to move back home, feeling like they need to work more things out.   Patient Active Problem List   Diagnosis Date Noted  . Sinus congestion 10/11/2013  . 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 Anxiety and depression 09/16/2011    Past Medical History  Diagnosis Date  . Depression   . Anxiety   . HSV-2 infection   . Allergy     No Known Allergies   Prior to Admission medications   Medication Sig Start Date End Date Taking? Authorizing Provider  citalopram (CELEXA) 20 MG tablet TAKE ONE TABLET BY MOUTH ONCE DAILY 11/03/14  Yes Caley Ciaramitaro, PA-C  valACYclovir (VALTREX) 500 MG tablet Take 1 tablet (500 mg total) by mouth daily. 09/28/14  Yes Lenita Peregrina, PA-C  rOPINIRole (REQUIP) 0.5 MG tablet Take 1 tablet 2-3 hours  before bedtime. Patient not taking: Reported on 11/23/2014 09/29/13   Porfirio Oarhelle Jervon Ream, PA-C    Family History  Problem Relation Age of Onset  . Hypertension Mother   . Stroke Mother [id number]  . Cancer Sister   . Cancer Maternal Grandfather   . Cancer Sister     skin    History   Social History  . Marital Status: Married    Spouse Name: Baxter HireKristen  . Number of Children: 2  . Years of Education: 12   Occupational History  . construction/contractor   . baseball coach    Social History Main Topics  . Smoking status: Never Smoker   . Smokeless tobacco: Never Used  . Alcohol Use: No  . Drug Use: No  . Sexual Activity:    Partners: Female   Other Topics Concern  . Not on file   Social History Narrative   Lives with his wife (married 07/22/1990) and their 2 sons. Does not exercise.      Review of Systems  Constitutional: Negative.   HENT: Negative.   Eyes: Negative.   Respiratory: Positive for cough (x 8 weeks, mild, but persistent after a respiratory illness and exposure to sawdust and hay he's using his son's albuterol inhlaer occasionally with good relief. Only happens when he changes environmental temperatures (like entering AC from  outside heat)). Negative for apnea, choking, chest tightness, shortness of breath, wheezing and stridor.   Cardiovascular: Negative.   Gastrointestinal: Negative.   Endocrine: Negative.   Genitourinary: Negative.   Musculoskeletal: Negative.   Skin: Positive for wound (8 wasp stings on the LEFT hand/5th finger yesterday). Negative for color change, pallor and rash.  Allergic/Immunologic: Negative.   Neurological: Negative.   Hematological: Negative.   Psychiatric/Behavioral: Negative.        Objective:   Physical Exam  Constitutional: He is oriented to person, place, and time. Vital signs are normal. He appears well-developed and well-nourished. He is active and cooperative.  Non-toxic appearance. He does not have a sickly appearance. He does  not appear ill. No distress.  BP 118/84 mmHg  Pulse 74  Temp(Src) 97.8 F (36.6 C) (Oral)  Resp 16  Ht 6' 1.25" (1.861 m)  Wt 228 lb (103.42 kg)  BMI 29.86 kg/m2   HENT:  Head: Normocephalic and atraumatic.  Right Ear: Hearing, tympanic membrane, external ear and ear canal normal.  Left Ear: Hearing, tympanic membrane, external ear and ear canal normal.  Nose: Nose normal.  Mouth/Throat: Uvula is midline, oropharynx is clear and moist and mucous membranes are normal. He does not have dentures. No oral lesions. No trismus in the jaw. Normal dentition. No dental abscesses, uvula swelling, lacerations or dental caries.  Eyes: Conjunctivae, EOM and lids are normal. Pupils are equal, round, and reactive to light. Right eye exhibits no discharge. Left eye exhibits no discharge. No scleral icterus.  Fundoscopic exam:      The right eye shows no arteriolar narrowing, no AV nicking, no exudate, no hemorrhage and no papilledema.       The left eye shows no arteriolar narrowing, no AV nicking, no exudate, no hemorrhage and no papilledema.  Neck: Normal range of motion, full passive range of motion without pain and phonation normal. Neck supple. No spinous process tenderness and no muscular tenderness present. No rigidity. No tracheal deviation, no edema, no erythema and normal range of motion present. No thyromegaly present.  Cardiovascular: Normal rate, regular rhythm, S1 normal, S2 normal, normal heart sounds, intact distal pulses and normal pulses.  Exam reveals no gallop and no friction rub.   No murmur heard. Pulmonary/Chest: Effort normal and breath sounds normal. No respiratory distress. He has no wheezes. He has no rales.  Abdominal: Soft. Normal appearance and bowel sounds are normal. He exhibits no distension and no mass. There is no hepatosplenomegaly. There is no tenderness. There is no rebound and no guarding. No hernia. Hernia confirmed negative in the right inguinal area and confirmed  negative in the left inguinal area.  Genitourinary: Rectum normal, prostate normal, testes normal and penis normal. Circumcised. No phimosis, paraphimosis, hypospadias, penile erythema or penile tenderness. No discharge found.  Musculoskeletal: Normal range of motion. He exhibits no edema or tenderness.       Right shoulder: Normal.       Left shoulder: Normal.       Right elbow: Normal.      Left elbow: Normal.       Right wrist: Normal.       Left wrist: Normal.       Right hip: Normal.       Left hip: Normal.       Right knee: Normal.       Left knee: Normal.       Right ankle: Normal. Achilles tendon normal.  Left ankle: Normal. Achilles tendon normal.       Cervical back: Normal. He exhibits normal range of motion, no tenderness, no bony tenderness, no swelling, no edema, no deformity, no laceration, no pain, no spasm and normal pulse.       Thoracic back: Normal.       Lumbar back: Normal.       Right upper arm: Normal.       Left upper arm: Normal.       Right forearm: Normal.       Left forearm: Normal.       Right hand: Normal.       Left hand: Normal.       Right upper leg: Normal.       Left upper leg: Normal.       Right lower leg: Normal.       Left lower leg: Normal.       Right foot: Normal.       Left foot: Normal.  Lymphadenopathy:       Head (right side): No submental, no submandibular, no tonsillar, no preauricular, no posterior auricular and no occipital adenopathy present.       Head (left side): No submental, no submandibular, no tonsillar, no preauricular, no posterior auricular and no occipital adenopathy present.    He has no cervical adenopathy.       Right: No inguinal and no supraclavicular adenopathy present.       Left: No inguinal and no supraclavicular adenopathy present.  Neurological: He is alert and oriented to person, place, and time. He has normal strength and normal reflexes. He displays no tremor. No cranial nerve deficit. He exhibits  normal muscle tone. Coordination and gait normal.  Skin: Skin is warm, dry and intact. No abrasion, no ecchymosis, no laceration, no lesion and no rash noted. He is not diaphoretic. No cyanosis or erythema. No pallor. Nails show no clubbing.  Psychiatric: He has a normal mood and affect. His speech is normal and behavior is normal. Judgment and thought content normal. Cognition and memory are normal.          Assessment & Plan:  1. Annual physical exam Age appropriate anticipatory guidance provided.  2. Cough Likely reactive. As it is improving, treat with PRN albuterol. If symptoms worsen or persist, re-evaluate with CXR. - albuterol (PROVENTIL HFA;VENTOLIN HFA) 108 (90 BASE) MCG/ACT inhaler; Inhale 2 puffs into the lungs every 4 (four) hours as needed for wheezing or shortness of breath (cough, shortness of breath or wheezing.).  Dispense: 1 Inhaler; Refill: 1  3. HSV-2 infection Stable. Years since last outbreak. Continue suppresive therapy. - valACYclovir (VALTREX) 500 MG tablet; Take 1 tablet (500 mg total) by mouth daily.  Dispense: 90 tablet; Refill: 3  4. Anxiety and depression Well controlled. Continue current treatment. - citalopram (CELEXA) 20 MG tablet; Take 1 tablet (20 mg total) by mouth daily.  Dispense: 90 tablet; Refill: 3  5. Screening for deficiency anemia - CBC with Differential/Platelet  6. Screening for metabolic disorder - Comprehensive metabolic panel  7. Screening for hyperlipidemia - Lipid panel  8. Screening for thyroid disorder - TSH  9. Screening for HIV (human immunodeficiency virus) - HIV antibody  10. Screening for prostate cancer - PSA   Fernande Bras, PA-C Physician Assistant-Certified [facility]

## 2014-11-23 NOTE — Patient Instructions (Signed)
I will contact you with your lab results as soon as they are available.   If you have not heard from me in 2 weeks, please contact me.  The fastest way to get your results is to register for My Chart (see the instructions on the last page of this printout).  Keeping you healthy  Get these tests  Blood pressure- Have your blood pressure checked once a year by your healthcare provider.  Normal blood pressure is 120/80.  Weight- Have your body mass index (BMI) calculated to screen for obesity.  BMI is a measure of body fat based on height and weight. You can also calculate your own BMI at www.nhlbisupport.com/bmi/.  Cholesterol- Have your cholesterol checked regularly starting at age 35, sooner may be necessary if you have diabetes, high blood pressure, if a family member developed heart diseases at an early age or if you smoke.   Chlamydia, HIV, and other sexual transmitted disease- Get screened each year until the age of 25 then within three months of each new sexual partner.  Diabetes- Have your blood sugar checked regularly if you have high blood pressure, high cholesterol, a family history of diabetes or if you are overweight.  Get these vaccines  Flu shot- Every fall.  Tetanus shot- Every 10 years.  Menactra- Single dose; prevents meningitis.  Take these steps  Don't smoke- If you do smoke, ask your healthcare provider about quitting. For tips on how to quit, go to www.smokefree.gov or call 1-800-QUIT-NOW.  Be physically active- Exercise 5 days a week for at least 30 minutes.  If you are not already physically active start slow and gradually work up to 30 minutes of moderate physical activity.  Examples of moderate activity include walking briskly, mowing the yard, dancing, swimming bicycling, etc.  Eat a healthy diet- Eat a variety of healthy foods such as fruits, vegetables, low fat milk, low fat cheese, yogurt, lean meats, poultry, fish, beans, tofu, etc.  For more information  on healthy eating, go to www.thenutritionsource.org  Drink alcohol in moderation- Limit alcohol intake two drinks or less a day.  Never drink and drive.  Dentist- Brush and floss teeth twice daily; visit your dentis twice a year.  Depression-Your emotional health is as important as your physical health.  If you're feeling down, losing interest in things you normally enjoy please talk with your healthcare provider.  Gun Safety- If you keep a gun in your home, keep it unloaded and with the safety lock on.  Bullets should be stored separately.  Helmet use- Always wear a helmet when riding a motorcycle, bicycle, rollerblading or skateboarding.  Safe sex- If you may be exposed to a sexually transmitted infection, use a condom  Seat belts- Seat bels can save your life; always wear one.  Smoke/Carbon Monoxide detectors- These detectors need to be installed on the appropriate level of your home.  Replace batteries at least once a year.  Skin Cancer- When out in the sun, cover up and use sunscreen SPF 15 or higher.  Violence- If anyone is threatening or hurting you, please tell your healthcare provider. 

## 2014-11-24 LAB — HIV ANTIBODY (ROUTINE TESTING W REFLEX): HIV 1&2 Ab, 4th Generation: NONREACTIVE

## 2014-11-24 LAB — PSA: PSA: 0.77 ng/mL (ref <=4.00)

## 2014-11-28 NOTE — Addendum Note (Signed)
Addended by: Fernande BrasJEFFERY, Andersson Larrabee S on: 11/28/2014 09:10 AM   Modules accepted: Orders

## 2015-02-07 LAB — BASIC METABOLIC PANEL
BUN: 9 mg/dL (ref 4–21)
CREATININE: 1.1 mg/dL (ref <=1.3)
Glucose: 81 mg/dL
Potassium: 3.9 mmol/L (ref 3.4–5.3)
SODIUM: 142 mmol/L (ref 137–147)

## 2015-02-07 LAB — CBC AND DIFFERENTIAL
HCT: 45 % (ref 41–53)
Hemoglobin: 15.6 g/dL (ref 13.5–17.5)
Neutrophils Absolute: 4 /uL
PLATELETS: 149 10*3/uL — AB (ref 150–399)
WBC: 7.4 10^3/mL

## 2015-02-07 LAB — HEPATIC FUNCTION PANEL
ALK PHOS: 47 U/L (ref 25–125)
ALT: 26 U/L (ref 10–40)
AST: 20 U/L (ref 14–40)
BILIRUBIN, TOTAL: 0.9 mg/dL

## 2015-02-12 ENCOUNTER — Encounter: Payer: Self-pay | Admitting: Physician Assistant

## 2015-02-12 DIAGNOSIS — K649 Unspecified hemorrhoids: Secondary | ICD-10-CM | POA: Insufficient documentation

## 2015-02-12 DIAGNOSIS — K641 Second degree hemorrhoids: Secondary | ICD-10-CM

## 2015-02-12 DIAGNOSIS — R197 Diarrhea, unspecified: Secondary | ICD-10-CM

## 2015-02-16 ENCOUNTER — Encounter: Payer: Self-pay | Admitting: *Deleted

## 2015-02-22 ENCOUNTER — Encounter: Payer: Self-pay | Admitting: Physician Assistant

## 2015-02-22 DIAGNOSIS — K641 Second degree hemorrhoids: Secondary | ICD-10-CM

## 2015-03-16 ENCOUNTER — Encounter: Payer: Self-pay | Admitting: Physician Assistant

## 2015-03-16 DIAGNOSIS — K641 Second degree hemorrhoids: Secondary | ICD-10-CM

## 2015-10-17 ENCOUNTER — Other Ambulatory Visit: Payer: Self-pay | Admitting: Physician Assistant

## 2015-12-04 ENCOUNTER — Ambulatory Visit (INDEPENDENT_AMBULATORY_CARE_PROVIDER_SITE_OTHER): Payer: BLUE CROSS/BLUE SHIELD | Admitting: Physician Assistant

## 2015-12-04 ENCOUNTER — Encounter: Payer: Self-pay | Admitting: Physician Assistant

## 2015-12-04 VITALS — BP 126/90 | HR 81 | Temp 97.6°F | Resp 16 | Ht 72.0 in | Wt 230.8 lb

## 2015-12-04 DIAGNOSIS — M25562 Pain in left knee: Secondary | ICD-10-CM | POA: Diagnosis not present

## 2015-12-04 DIAGNOSIS — F32A Depression, unspecified: Secondary | ICD-10-CM

## 2015-12-04 DIAGNOSIS — Z1389 Encounter for screening for other disorder: Secondary | ICD-10-CM

## 2015-12-04 DIAGNOSIS — R059 Cough, unspecified: Secondary | ICD-10-CM

## 2015-12-04 DIAGNOSIS — Z6831 Body mass index (BMI) 31.0-31.9, adult: Secondary | ICD-10-CM | POA: Diagnosis not present

## 2015-12-04 DIAGNOSIS — R05 Cough: Secondary | ICD-10-CM | POA: Diagnosis not present

## 2015-12-04 DIAGNOSIS — Z13228 Encounter for screening for other metabolic disorders: Secondary | ICD-10-CM | POA: Diagnosis not present

## 2015-12-04 DIAGNOSIS — M25521 Pain in right elbow: Secondary | ICD-10-CM | POA: Diagnosis not present

## 2015-12-04 DIAGNOSIS — B009 Herpesviral infection, unspecified: Secondary | ICD-10-CM | POA: Diagnosis not present

## 2015-12-04 DIAGNOSIS — F418 Other specified anxiety disorders: Secondary | ICD-10-CM | POA: Diagnosis not present

## 2015-12-04 DIAGNOSIS — F419 Anxiety disorder, unspecified: Secondary | ICD-10-CM

## 2015-12-04 DIAGNOSIS — Z139 Encounter for screening, unspecified: Secondary | ICD-10-CM | POA: Diagnosis not present

## 2015-12-04 DIAGNOSIS — Z130 Encounter for screening for diseases of the blood and blood-forming organs and certain disorders involving the immune mechanism: Secondary | ICD-10-CM | POA: Diagnosis not present

## 2015-12-04 DIAGNOSIS — Z0000 Encounter for general adult medical examination without abnormal findings: Secondary | ICD-10-CM | POA: Diagnosis not present

## 2015-12-04 DIAGNOSIS — F329 Major depressive disorder, single episode, unspecified: Secondary | ICD-10-CM

## 2015-12-04 DIAGNOSIS — M25561 Pain in right knee: Secondary | ICD-10-CM | POA: Diagnosis not present

## 2015-12-04 DIAGNOSIS — Z13220 Encounter for screening for lipoid disorders: Secondary | ICD-10-CM

## 2015-12-04 LAB — COMPREHENSIVE METABOLIC PANEL
ALBUMIN: 4.5 g/dL (ref 3.6–5.1)
ALK PHOS: 45 U/L (ref 40–115)
ALT: 36 U/L (ref 9–46)
AST: 23 U/L (ref 10–40)
BILIRUBIN TOTAL: 1.2 mg/dL (ref 0.2–1.2)
BUN: 13 mg/dL (ref 7–25)
CHLORIDE: 105 mmol/L (ref 98–110)
CO2: 26 mmol/L (ref 20–31)
CREATININE: 1.21 mg/dL (ref 0.60–1.35)
Calcium: 9.1 mg/dL (ref 8.6–10.3)
Glucose, Bld: 94 mg/dL (ref 65–99)
Potassium: 4.2 mmol/L (ref 3.5–5.3)
SODIUM: 138 mmol/L (ref 135–146)
Total Protein: 7.2 g/dL (ref 6.1–8.1)

## 2015-12-04 LAB — CBC WITH DIFFERENTIAL/PLATELET
BASOS ABS: 75 {cells}/uL (ref 0–200)
Basophils Relative: 1 %
EOS PCT: 2 %
Eosinophils Absolute: 150 cells/uL (ref 15–500)
HCT: 44 % (ref 38.5–50.0)
HEMOGLOBIN: 15.8 g/dL (ref 13.2–17.1)
LYMPHS ABS: 2250 {cells}/uL (ref 850–3900)
Lymphocytes Relative: 30 %
MCH: 33.1 pg — AB (ref 27.0–33.0)
MCHC: 35.9 g/dL (ref 32.0–36.0)
MCV: 92.1 fL (ref 80.0–100.0)
MONOS PCT: 7 %
MPV: 9.7 fL (ref 7.5–12.5)
Monocytes Absolute: 525 cells/uL (ref 200–950)
NEUTROS PCT: 60 %
Neutro Abs: 4500 cells/uL (ref 1500–7800)
Platelets: 153 10*3/uL (ref 140–400)
RBC: 4.78 MIL/uL (ref 4.20–5.80)
RDW: 14.6 % (ref 11.0–15.0)
WBC: 7.5 10*3/uL (ref 3.8–10.8)

## 2015-12-04 LAB — POC MICROSCOPIC URINALYSIS (UMFC)

## 2015-12-04 LAB — POCT URINALYSIS DIP (MANUAL ENTRY)
Bilirubin, UA: NEGATIVE
GLUCOSE UA: NEGATIVE
Ketones, POC UA: NEGATIVE
Leukocytes, UA: NEGATIVE
NITRITE UA: NEGATIVE
PROTEIN UA: NEGATIVE
RBC UA: NEGATIVE
Spec Grav, UA: >=1.03
Urobilinogen, UA: 0.2
pH, UA: 5.5

## 2015-12-04 LAB — LIPID PANEL
CHOLESTEROL: 129 mg/dL (ref 125–200)
HDL: 33 mg/dL — ABNORMAL LOW (ref >=40)
LDL Cholesterol: 73 mg/dL (ref <130)
TRIGLYCERIDES: 113 mg/dL (ref <150)
Total CHOL/HDL Ratio: 3.9 Ratio (ref <=5.0)
VLDL: 23 mg/dL (ref <30)

## 2015-12-04 MED ORDER — VALACYCLOVIR HCL 500 MG PO TABS: 500.0000 mg | ORAL_TABLET | Freq: Every day | ORAL | Status: DC

## 2015-12-04 MED ORDER — MELOXICAM 15 MG PO TABS: 15.0000 mg | ORAL_TABLET | Freq: Every day | ORAL | Status: DC

## 2015-12-04 MED ORDER — ALBUTEROL SULFATE HFA 108 (90 BASE) MCG/ACT IN AERS: 2.0000 | INHALATION_SPRAY | RESPIRATORY_TRACT | Status: DC | PRN

## 2015-12-04 MED ORDER — CITALOPRAM HYDROBROMIDE 20 MG PO TABS: 20.0000 mg | ORAL_TABLET | Freq: Every day | ORAL | Status: DC

## 2015-12-04 NOTE — Progress Notes (Signed)
Patient ID: Kevin West, male    DOB: [birth year], 45 y.o.   MRN: [id number]  PCP: Porfirio Oar, PA-C  Chief Complaint  Patient presents with  . Annual Exam  . Medication Refill    all meds    Subjective:   HPI: Presents for annual wellness exam.  Colorectal Cancer Screening: not yet a candidate Prostate Cancer Screening: normal PSA last year Bone Density Testing: not yet a candidate HIV Screening: complete STI Screening: very low risk, known HSV Seasonal Influenza Vaccination: recommended annually Td/Tdap Vaccination: current Pneumococcal Vaccination: not yet a candidate Zoster Vaccination: not yet a candidate   He reports that he is doing really well. Stress at home is much better now that his wife is treating her anxiety symptoms. He is having bilateral knee pain that is keeping him awake at night. He hasn't tried anything consistent to alleviate the pain, but notes that his wife massaged the knees last night and he got the best sleep he's had in years. 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    Patient Active Problem List   Diagnosis Date Noted  . BMI 31.0-31.9,adult 12/04/2015  . Pain in right elbow 12/04/2015  . Hemorrhoids 02/12/2015  . Diarrhea 02/12/2015  . Sinus congestion 10/11/2013  . 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 Anxiety and depression 09/16/2011    Past Medical History  Diagnosis Date  . Depression   . Anxiety   . HSV-2 infection   . Allergy      Prior to Admission medications   Medication Sig Start Date End Date Taking? Authorizing Provider  albuterol (PROVENTIL HFA;VENTOLIN HFA) 108 (90 Base) MCG/ACT inhaler Inhale 2 puffs into the lungs every 4 (four) hours as needed for wheezing or shortness of breath (cough, shortness of breath or wheezing.). 12/04/15  Yes Woodley Petzold, PA-C  citalopram (CELEXA) 20 MG tablet Take 1 tablet (20 mg total) by mouth daily. 12/04/15   Yes Akhil Piscopo, PA-C  valACYclovir (VALTREX) 500 MG tablet Take 1 tablet (500 mg total) by mouth daily. 12/04/15  Yes Yazir Koerber, PA-C  meloxicam (MOBIC) 15 MG tablet Take 1 tablet (15 mg total) by mouth daily. 12/04/15   Porfirio Oar, PA-C    Allergies  Allergen Reactions  . Milk-Related Compounds Diarrhea    Past Surgical History  Procedure Laterality Date  . Hernia repair    . Vasectomy  2000  . Nose surgery      fracture  . Nasal sinus surgery      Family History  Problem Relation Age of Onset  . Hypertension Mother   . Stroke Mother 55  . Cancer Sister   . Cancer Maternal Grandfather   . Cancer Sister     skin    Social History   Social History  . Marital Status: Married    Spouse Name: Baxter Hire  . Number of Children: 2  . Years of Education: 12   Occupational History  . construction/contractor   . baseball coach    Social History Main Topics  . Smoking status: Never Smoker   . Smokeless tobacco: Never Used  . Alcohol Use: No  . Drug Use: No  . Sexual Activity:    Partners: Female   Other Topics Concern  . None   Social History Narrative   Lives with his wife (married 07/22/1990) and their 2 sons.    Exercise: Yes   Education: [employer]  Review of Systems  Constitutional: Negative.   HENT: Negative.   Eyes: Positive for visual disturbance (noted that his vision wasn't as clear on today's screening).  Respiratory: Negative.   Cardiovascular: Negative.   Gastrointestinal: Negative.   Endocrine: Negative.   Genitourinary: Negative.   Musculoskeletal: Positive for arthralgias (both knees, RIGHT elbow).  Skin: Negative.   Allergic/Immunologic: Negative.   Neurological: Negative.   Hematological: Negative.   Psychiatric/Behavioral: Negative.         Objective:  Physical Exam  Constitutional: He is oriented to person, place, and time. He appears well-developed and well-nourished. He is active and cooperative.  Non-toxic  appearance. He does not have a sickly appearance. He does not appear ill. No distress.  BP 126/90 mmHg  Pulse 81  Temp(Src) 97.6 F (36.4 C) (Oral)  Resp 16  Ht 6' (1.829 m)  Wt 230 lb 12.8 oz (104.69 kg)  BMI 31.30 kg/m2  SpO2 96%   HENT:  Head: Normocephalic and atraumatic.  Right Ear: Hearing, tympanic membrane, external ear and ear canal normal.  Left Ear: Hearing, tympanic membrane, external ear and ear canal normal.  Nose: Nose normal.  Mouth/Throat: Uvula is midline, oropharynx is clear and moist and mucous membranes are normal. He does not have dentures. No oral lesions. No trismus in the jaw. Normal dentition. No dental abscesses, uvula swelling, lacerations or dental caries.  Eyes: Conjunctivae, EOM and lids are normal. Pupils are equal, round, and reactive to light. Right eye exhibits no discharge. Left eye exhibits no discharge. No scleral icterus.  Fundoscopic exam:      The right eye shows no arteriolar narrowing, no AV nicking, no exudate, no hemorrhage and no papilledema. The right eye shows red reflex.       The left eye shows no arteriolar narrowing, no AV nicking, no exudate, no hemorrhage and no papilledema. The left eye shows red reflex.  Visual Acuity in Right Eye - Without correction: 20/30  With correction:  Visual Acuity in Left Eye - Without correction: 20/40  With correction:  Visual Acuity in Both Eyes - Without correction: 20/30  With correction:     Neck: Normal range of motion, full passive range of motion without pain and phonation normal. Neck supple. No spinous process tenderness and no muscular tenderness present. No rigidity. No tracheal deviation, no edema, no erythema and normal range of motion present. No thyromegaly present.  Cardiovascular: Normal rate, regular rhythm, S1 normal, S2 normal, normal heart sounds, intact distal pulses and normal pulses.  Exam reveals no gallop and no friction rub.   No murmur heard. Pulmonary/Chest: Effort normal and  breath sounds normal. No respiratory distress. He has no wheezes. He has no rales.  Abdominal: Soft. Normal appearance and bowel sounds are normal. He exhibits no distension and no mass. There is no hepatosplenomegaly. There is no tenderness. There is no rebound and no guarding. No hernia.  Musculoskeletal: Normal range of motion. He exhibits no edema or tenderness.       Cervical back: Normal. He exhibits normal range of motion, no tenderness, no bony tenderness, no swelling, no edema, no deformity, no laceration, no pain, no spasm and normal pulse.       Thoracic back: Normal.       Lumbar back: Normal.  Lymphadenopathy:       Head (right side): No submental, no submandibular, no tonsillar, no preauricular, no posterior auricular and no occipital adenopathy present.       Head (left side):  No submental, no submandibular, no tonsillar, no preauricular, no posterior auricular and no occipital adenopathy present.    He has no cervical adenopathy.       Right: No supraclavicular adenopathy present.       Left: No supraclavicular adenopathy present.  Neurological: He is alert and oriented to person, place, and time. He has normal strength and normal reflexes. He displays no tremor. No cranial nerve deficit. He exhibits normal muscle tone. Coordination and gait normal.  Skin: Skin is warm, dry and intact. No abrasion, no ecchymosis, no laceration, no lesion and no rash noted. He is not diaphoretic. No cyanosis or erythema. No pallor. Nails show no clubbing.  Psychiatric: He has a normal mood and affect. His speech is normal and behavior is normal. Judgment and thought content normal. Cognition and memory are normal.           Assessment & Plan:  1. Annual physical exam Age appropriate anticipatory guidance provided.  2. Anxiety and depression Stable, controlled. - citalopram (CELEXA) 20 MG tablet; Take 1 tablet (20 mg total) by mouth daily.  Dispense: 90 tablet; Refill: 3  3. HSV-2  infection Stable, controlled.  - valACYclovir (VALTREX) 500 MG tablet; Take 1 tablet (500 mg total) by mouth daily.  Dispense: 90 tablet; Refill: 3  4. BMI 31.0-31.9,adult Healthy eating, regular exercise encouraged.  5. Screening for blood or protein in urine - POCT urinalysis dipstick - POCT Microscopic Urinalysis (UMFC)  6. Screening for deficiency anemia - CBC with Differential/Platelet  7. Screening for metabolic disorder - Comprehensive metabolic panel  8. Screening for hyperlipidemia - Lipid panel  9. Cough Stable. - albuterol (PROVENTIL HFA;VENTOLIN HFA) 108 (90 Base) MCG/ACT inhaler; Inhale 2 puffs into the lungs every 4 (four) hours as needed for wheezing or shortness of breath (cough, shortness of breath or wheezing.).  Dispense: 1 Inhaler; Refill: 1  10. Knee pain, bilateral 11. 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 He was a Naval architectpitcher. Trial of meloxicam, and continue massage of the knees, as that has helped. If no better, RTC for additional evaluation. - meloxicam (MOBIC) 15 MG tablet; Take 1 tablet (15 mg total) by mouth daily.  Dispense: 30 tablet; Refill: 1   Return in about 1 year (around 12/03/2016), or if knee and elbow pain worsen or fail to improve.     Fernande Brashelle S. Bryker Fletchall, PA-C Physician Assistant-Certified [facility]

## 2015-12-04 NOTE — Progress Notes (Signed)
   Subjective:    Patient ID: Kevin HaberDavid Ask, male    DOB: 10-05-1970, 45 y.o.   MRN: [id number]  HPI    Review of Systems     Objective:   Physical Exam        Assessment & Plan:

## 2015-12-04 NOTE — Progress Notes (Signed)
   Subjective:    Patient ID: Kevin West, male    DOB: 06/19/1970, 45 y.o.   MRN: [id number]  HPI    Review of Systems     Objective:   Physical Exam        Assessment & Plan:   

## 2015-12-04 NOTE — Patient Instructions (Signed)
I recommend that you continue the evening knee massages.    IF you received an x-ray today, you will receive an invoice from [facility]. Please contact [facility] at [phone] with questions or concerns regarding your invoice.   IF you received labwork today, you will receive an invoice from [facility]. Please contact [facility] at [phone] with questions or concerns regarding your invoice.   Our billing staff will not be able to assist you with questions regarding bills from these companies.  You will be contacted with the lab results as soon as they are available. The fastest way to get your results is to activate your My Chart account. Instructions are located on the last page of this paperwork. If you have not heard from us regarding the results in 2 weeks, please contact this office.      [facility]  [address], [place], [place] [address]  Phone: [phone]  [facility] [address], [place], [place] [address]  Phone: [phone]  [facility] [address], [place], [place] [address]  Phone: [phone]    Keeping you healthy  Get these tests  Blood pressure- Have your blood pressure checked once a year by your healthcare provider.  Normal blood pressure is 120/80.  Weight- Have your body mass index (BMI) calculated to screen for obesity.  BMI is a measure of body fat based on height and weight. You can also calculate your own BMI at [email or web address]-[email or web address]/.  Cholesterol- Have your cholesterol checked regularly starting at age 46, sooner may be necessary if you have diabetes, high blood pressure, if a family member developed heart diseases at an early age or if you smoke.   Chlamydia, HIV, and other sexual transmitted disease- Get screened each year until the age of 46 then within three months of each new sexual partner.  Diabetes- Have your blood sugar checked regularly if you have high blood  pressure, high cholesterol, a family history of diabetes or if you are overweight.  Get these vaccines  Flu shot- Every fall.  Tetanus shot- Every 10 years.  Menactra- Single dose; prevents meningitis.  Take these steps  Don't smoke- If you do smoke, ask your healthcare provider about quitting. For tips on how to quit, go to www.smokefree.gov or call 1-800-QUIT-NOW.  Be physically active- Exercise 5 days a week for at least 30 minutes.  If you are not already physically active start slow and gradually work up to 30 minutes of moderate physical activity.  Examples of moderate activity include walking briskly, mowing the yard, dancing, swimming bicycling, etc.  Eat a healthy diet- Eat a variety of healthy foods such as fruits, vegetables, low fat milk, low fat cheese, yogurt, lean meats, poultry, fish, beans, tofu, etc.  For more information on healthy eating, go to www.thenutritionsource.org  Drink alcohol in moderation- Limit alcohol intake two drinks or less a day.  Never drink and drive.  Dentist- Brush and floss teeth twice daily; visit your dentis twice a year.  Depression-Your emotional health is as important as your physical health.  If you're feeling down, losing interest in things you normally enjoy please talk with your healthcare provider.  Gun Safety- If you keep a gun in your home, keep it unloaded and with the safety lock on.  Bullets should be stored separately.  Helmet use- Always wear a helmet when riding a motorcycle, bicycle, rollerblading or skateboarding.  Safe sex- If you may be exposed to  a sexually transmitted infection, use a condom  Seat belts- Seat bels can save your life; always wear one.  Smoke/Carbon Monoxide detectors- These detectors need to be installed on the appropriate level of your home.  Replace batteries at least once a year.  Skin Cancer- When out in the sun, cover up and use sunscreen SPF 15 or higher.  Violence- If anyone is threatening or  hurting you, please tell your healthcare provider.

## 2015-12-04 NOTE — Progress Notes (Signed)
Subjective:    Patient ID: Sadie HaberDavid Kollmann, male    DOB: [birth year], 45 y.o.   MRN: [id number]  HPI Work is busy but going well. 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 Relationship with his wife is improved. Has 2 sons, both at [employer]. Oldest son, Durene CalHunter, interested in the medical field. Owns 2 farms, and has horses and cows. [place] and [place] on the weekends.   Having knee pain. Attributes this to active job and lifestyle. At times severe and causes knees to buckle. Relieved with massage.    Review of Systems  Constitutional: Negative for unexpected weight change.  HENT: Negative for congestion and hearing loss.   Eyes: Positive for visual disturbance.  Respiratory: Negative for chest tightness and shortness of breath.   Cardiovascular: Negative for chest pain, palpitations and leg swelling.  Gastrointestinal: Negative for abdominal pain, diarrhea, constipation and blood in stool.  Genitourinary: Negative for scrotal swelling, difficulty urinating, genital sores and testicular pain.  Musculoskeletal: Positive for back pain and arthralgias.       BL knee pain  Allergic/Immunologic: Positive for food allergies.       Milk products  Neurological: Negative for headaches.       Objective:   Physical Exam  Constitutional: He is oriented to person, place, and time. Vital signs are normal. He appears well-developed and well-nourished. He is cooperative.  HENT:  Head: Normocephalic and atraumatic.  Right Ear: Tympanic membrane, external ear and ear canal normal.  Left Ear: Tympanic membrane, external ear and ear canal normal.  Nose: Nose normal.  Mouth/Throat: Uvula is midline, oropharynx is clear and moist and mucous membranes are normal.  Eyes: Conjunctivae and lids are normal.  Neck: Trachea normal. Neck supple.  Cardiovascular: Normal rate, regular rhythm, normal heart sounds and normal pulses.   Pulmonary/Chest: Effort normal and  breath sounds normal.  Abdominal: Soft. Bowel sounds are normal. There is no tenderness.  Musculoskeletal:       Right knee: He exhibits no swelling, no effusion and no erythema. No tenderness found.       Left knee: He exhibits no swelling, no effusion and no erythema. No tenderness found.  Lymphadenopathy:    He has no cervical adenopathy.  Neurological: He is alert and oriented to person, place, and time. He has normal strength and normal reflexes.  Skin: Skin is warm, dry and intact.  Psychiatric: He has a normal mood and affect. His speech is normal and behavior is normal. Judgment and thought content normal. Cognition and memory are normal.   Filed Vitals:   12/04/15 0851  BP: 126/90  Pulse: 81  Temp: 97.6 F (36.4 C)  Resp: 16    Current outpatient prescriptions:  .  albuterol (PROVENTIL HFA;VENTOLIN HFA) 108 (90 Base) MCG/ACT inhaler, Inhale 2 puffs into the lungs every 4 (four) hours as needed for wheezing or shortness of breath (cough, shortness of breath or wheezing.)., Disp: 1 Inhaler, Rfl: 1 .  citalopram (CELEXA) 20 MG tablet, Take 1 tablet (20 mg total) by mouth daily., Disp: 90 tablet, Rfl: 3 .  valACYclovir (VALTREX) 500 MG tablet, Take 1 tablet (500 mg total) by mouth daily., Disp: 90 tablet, Rfl: 3 .  meloxicam (MOBIC) 15 MG tablet, Take 1 tablet (15 mg total) by mouth daily., Disp: 30 tablet, Rfl: 1   Allergies  Allergen Reactions  . Milk-Related Compounds Diarrhea        Assessment & Plan:  1. Annual exam - doing well overall - check CBC, CMET, Lipid panel, UA, urine dipstick. Will f/u with patient on results.  2. Anxiety and depression - continue with citalopram 20 mg PO daily  3. HSV-2 Infection - continue with valacyclovir 500 mg PO daily  4. BMI > 30 - encouraged exercise and healthy eating  5. Pain in BL knees and right elbow - meloxicam 15 mg PO daily - probably age-related changes, OA - if not improved, may consider ordering  radiographs  Zaraya Delauder Rayburn PA-S 12/04/2015

## 2015-12-06 ENCOUNTER — Encounter: Payer: BLUE CROSS/BLUE SHIELD | Admitting: Physician Assistant

## 2015-12-08 ENCOUNTER — Encounter: Payer: Self-pay | Admitting: Physician Assistant

## 2016-12-03 ENCOUNTER — Encounter: Payer: Self-pay | Admitting: Physician Assistant

## 2016-12-03 ENCOUNTER — Ambulatory Visit (INDEPENDENT_AMBULATORY_CARE_PROVIDER_SITE_OTHER): Payer: BLUE CROSS/BLUE SHIELD | Admitting: Physician Assistant

## 2016-12-03 VITALS — BP 134/94 | HR 78 | Temp 98.6°F | Resp 16 | Ht 72.0 in | Wt 233.0 lb

## 2016-12-03 DIAGNOSIS — G2581 Restless legs syndrome: Secondary | ICD-10-CM | POA: Diagnosis not present

## 2016-12-03 DIAGNOSIS — Z13220 Encounter for screening for lipoid disorders: Secondary | ICD-10-CM

## 2016-12-03 DIAGNOSIS — M25561 Pain in right knee: Secondary | ICD-10-CM

## 2016-12-03 DIAGNOSIS — F419 Anxiety disorder, unspecified: Secondary | ICD-10-CM

## 2016-12-03 DIAGNOSIS — Z13228 Encounter for screening for other metabolic disorders: Secondary | ICD-10-CM

## 2016-12-03 DIAGNOSIS — Z130 Encounter for screening for diseases of the blood and blood-forming organs and certain disorders involving the immune mechanism: Secondary | ICD-10-CM

## 2016-12-03 DIAGNOSIS — Z0000 Encounter for general adult medical examination without abnormal findings: Secondary | ICD-10-CM

## 2016-12-03 DIAGNOSIS — Z1389 Encounter for screening for other disorder: Secondary | ICD-10-CM | POA: Diagnosis not present

## 2016-12-03 DIAGNOSIS — Z1329 Encounter for screening for other suspected endocrine disorder: Secondary | ICD-10-CM | POA: Diagnosis not present

## 2016-12-03 DIAGNOSIS — B009 Herpesviral infection, unspecified: Secondary | ICD-10-CM | POA: Diagnosis not present

## 2016-12-03 DIAGNOSIS — F329 Major depressive disorder, single episode, unspecified: Secondary | ICD-10-CM

## 2016-12-03 DIAGNOSIS — G8929 Other chronic pain: Secondary | ICD-10-CM

## 2016-12-03 DIAGNOSIS — M25562 Pain in left knee: Secondary | ICD-10-CM

## 2016-12-03 DIAGNOSIS — Z6831 Body mass index (BMI) 31.0-31.9, adult: Secondary | ICD-10-CM | POA: Diagnosis not present

## 2016-12-03 DIAGNOSIS — R05 Cough: Secondary | ICD-10-CM

## 2016-12-03 DIAGNOSIS — R030 Elevated blood-pressure reading, without diagnosis of hypertension: Secondary | ICD-10-CM

## 2016-12-03 DIAGNOSIS — R059 Cough, unspecified: Secondary | ICD-10-CM

## 2016-12-03 DIAGNOSIS — R7989 Other specified abnormal findings of blood chemistry: Secondary | ICD-10-CM

## 2016-12-03 DIAGNOSIS — F32A Depression, unspecified: Secondary | ICD-10-CM

## 2016-12-03 LAB — POCT URINALYSIS DIP (MANUAL ENTRY)
Bilirubin, UA: NEGATIVE
Blood, UA: NEGATIVE
Glucose, UA: NEGATIVE mg/dL
Ketones, POC UA: NEGATIVE mg/dL
LEUKOCYTES UA: NEGATIVE
Nitrite, UA: NEGATIVE
PH UA: 5.5 (ref 5.0–8.0)
UROBILINOGEN UA: 1 U/dL

## 2016-12-03 MED ORDER — ROPINIROLE HCL ER 2 MG PO TB24: 2.0000 mg | ORAL_TABLET | Freq: Every day | ORAL | 1 refills | Status: DC

## 2016-12-03 MED ORDER — CITALOPRAM HYDROBROMIDE 20 MG PO TABS: 20.0000 mg | ORAL_TABLET | Freq: Every day | ORAL | 3 refills | Status: DC

## 2016-12-03 MED ORDER — MELOXICAM 15 MG PO TABS: 15.0000 mg | ORAL_TABLET | Freq: Every day | ORAL | 1 refills | Status: AC

## 2016-12-03 MED ORDER — ALBUTEROL SULFATE HFA 108 (90 BASE) MCG/ACT IN AERS: 2.0000 | INHALATION_SPRAY | RESPIRATORY_TRACT | 1 refills | Status: AC | PRN

## 2016-12-03 MED ORDER — VALACYCLOVIR HCL 500 MG PO TABS: 500.0000 mg | ORAL_TABLET | Freq: Every day | ORAL | 3 refills | Status: DC

## 2016-12-03 NOTE — Assessment & Plan Note (Signed)
Well controlled. Continue current treatment. 

## 2016-12-03 NOTE — Patient Instructions (Addendum)
   IF you received an x-ray today, you will receive an invoice from [facility]. Please contact  Radiology at [phone] with questions or concerns regarding your invoice.   IF you received labwork today, you will receive an invoice from [facility]. Please contact [facility] at [phone] with questions or concerns regarding your invoice.   Our billing staff will not be able to assist you with questions regarding bills from these companies.  You will be contacted with the lab results as soon as they are available. The fastest way to get your results is to activate your My Chart account. Instructions are located on the last page of this paperwork. If you have not heard from us regarding the results in 2 weeks, please contact this office.     Keeping you healthy  Get these tests  Blood pressure- Have your blood pressure checked once a year by your healthcare provider.  Normal blood pressure is 120/80.  Weight- Have your body mass index (BMI) calculated to screen for obesity.  BMI is a measure of body fat based on height and weight. You can also calculate your own BMI at www.nhlbisupport.com/bmi/.  Cholesterol- Have your cholesterol checked regularly starting at age 35, sooner may be necessary if you have diabetes, high blood pressure, if a family member developed heart diseases at an early age or if you smoke.   Chlamydia, HIV, and other sexual transmitted disease- Get screened each year until the age of 25 then within three months of each new sexual partner.  Diabetes- Have your blood sugar checked regularly if you have high blood pressure, high cholesterol, a family history of diabetes or if you are overweight.  Get these vaccines  Flu shot- Every fall.  Tetanus shot- Every 10 years.  Menactra- Single dose; prevents meningitis.  Take these steps  Don't smoke- If you do smoke, ask your healthcare provider about quitting. For tips on how to quit, go to  www.smokefree.gov or call 1-800-QUIT-NOW.  Be physically active- Exercise 5 days a week for at least 30 minutes.  If you are not already physically active start slow and gradually work up to 30 minutes of moderate physical activity.  Examples of moderate activity include walking briskly, mowing the yard, dancing, swimming bicycling, etc.  Eat a healthy diet- Eat a variety of healthy foods such as fruits, vegetables, low fat milk, low fat cheese, yogurt, lean meats, poultry, fish, beans, tofu, etc.  For more information on healthy eating, go to www.thenutritionsource.org  Drink alcohol in moderation- Limit alcohol intake two drinks or less a day.  Never drink and drive.  Dentist- Brush and floss teeth twice daily; visit your dentis twice a year.  Depression-Your emotional health is as important as your physical health.  If you're feeling down, losing interest in things you normally enjoy please talk with your healthcare provider.  Gun Safety- If you keep a gun in your home, keep it unloaded and with the safety lock on.  Bullets should be stored separately.  Helmet use- Always wear a helmet when riding a motorcycle, bicycle, rollerblading or skateboarding.  Safe sex- If you may be exposed to a sexually transmitted infection, use a condom  Seat belts- Seat bels can save your life; always wear one.  Smoke/Carbon Monoxide detectors- These detectors need to be installed on the appropriate level of your home.  Replace batteries at least once a year.  Skin Cancer- When out in the sun, cover up and use sunscreen SPF 15 or   higher.  Violence- If anyone is threatening or hurting you, please tell your healthcare provider. 

## 2016-12-03 NOTE — Progress Notes (Signed)
Patient ID: Kevin West, male    DOB: 1970/10/23, 46 y.o.   MRN: [id number]  PCP: Porfirio Oar, PA-C  Chief Complaint  Patient presents with  . Annual Exam    Subjective:   Presents for [name].  No recent need for albuterol inhaler. Relates tat his wife uses it occasionally. Allergies seem to be worsening with age, but still aren't too bad. Never filled the Rx for Ropinirole last year for restless leg symptoms. Symptoms persist. Massage helps sometimes. Just feels like he can't stop moving the legs. No numbness or tingling.  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 Initially, the congregation at his church judged him, but through discourse have come around to supporting his work there. 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  Continues to work in Holiday representative.  Colorectal Cancer Screening: not yet a candidate Prostate Cancer Screening: 11/23/14 0.77 Bone Density Testing: not yet a candidate HIV Screening: negative 10/2014 STI Screening: known HSV. Very low risk. Seasonal Influenza Vaccination: encouraged annually Td/Tdap Vaccination: 06/2010 Pneumococcal Vaccination: not yet a candidate Zoster Vaccination: not yet a candidate   Patient Active Problem List   Diagnosis Date Noted  . BMI 31.0-31.9,adult 12/04/2015  . Hemorrhoids 02/12/2015  . Diarrhea 02/12/2015  . Sinus congestion 10/11/2013  . 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 Anxiety and depression 09/16/2011    Past Medical History:  Diagnosis Date  . Allergy   . Anxiety   . Depression   . HSV-2 infection      Prior to Admission medications   Medication Sig Start Date End Date Taking? Authorizing Provider  albuterol (PROVENTIL HFA;VENTOLIN HFA) 108 (90 Base) MCG/ACT inhaler Inhale 2 puffs into the lungs every 4 (four) hours as needed for wheezing or shortness of breath (cough, shortness of breath or  wheezing.). 12/04/15  Yes Zain Bingman, PA-C  citalopram (CELEXA) 20 MG tablet Take 1 tablet (20 mg total) by mouth daily. 12/04/15  Yes Dewight Catino, PA-C  meloxicam (MOBIC) 15 MG tablet Take 1 tablet (15 mg total) by mouth daily. 12/04/15  Yes Charity Tessier, PA-C  valACYclovir (VALTREX) 500 MG tablet Take 1 tablet (500 mg total) by mouth daily. 12/04/15  Yes Porfirio Oar, PA-C    Allergies  Allergen Reactions  . Milk-Related Compounds Diarrhea  . Peanut-Containing Drug Products Other (See Comments)    Triggers HSV outbreak    Past Surgical History:  Procedure Laterality Date  . HERNIA REPAIR    . NASAL SINUS SURGERY    . NOSE SURGERY     fracture  . VASECTOMY  2000    Family History  Problem Relation Age of Onset  . Hypertension Mother   . Stroke Mother 72  . Cancer Sister   . Cancer Maternal Grandfather   . Cancer Sister        skin    Social History   Social History  . Marital status: Married    Spouse name: Baxter Hire  . Number of children: 2  . Years of education: 12   Occupational History  . construction/contractor   . baseball coach   . bar owner    Social History Main Topics  . Smoking status: Never Smoker  . Smokeless tobacco: Never Used  . Alcohol use No  . Drug use: No  . Sexual activity: Yes    Partners: Female   Other Topics Concern  .  None   Social History Narrative   Lives with his wife (married 07/22/1990) and their 2 sons.   Kevin West has graduated from [employer] and is applying to [employer].   Kevin West has taken time off from school (also [employer]) to decide what he'd like to do.    Exercise: Yes   Education: High School           Review of Systems  Constitutional: Negative.   HENT: Positive for congestion, postnasal drip and rhinorrhea. Negative for dental problem, drooling, ear discharge, ear pain, facial swelling, hearing loss, mouth sores, nosebleeds, sinus pain, sinus pressure, sneezing, sore throat, tinnitus,  trouble swallowing and voice change.   Eyes: Negative.   Respiratory: Negative.   Cardiovascular: Negative.   Gastrointestinal: Positive for diarrhea (intermittently). Negative for abdominal distention, abdominal pain, anal bleeding, blood in stool, constipation, nausea, rectal pain and vomiting.  Endocrine: Negative.   Genitourinary: Negative.   Musculoskeletal: Positive for arthralgias (knees) and myalgias (legs). Negative for back pain, gait problem, joint swelling, neck pain and neck stiffness.  Skin: Negative.   Allergic/Immunologic: Negative.   Neurological: Negative for dizziness, tremors, seizures, syncope, facial asymmetry, speech difficulty, weakness, light-headedness, numbness and headaches.       Restless legs  Hematological: Negative.   Psychiatric/Behavioral: Negative.         Objective:  Physical Exam  Constitutional: He is oriented to person, place, and time. He appears well-developed and well-nourished. He is active and cooperative.  Non-toxic appearance. He does not have a sickly appearance. He does not appear ill. No distress.  BP (!) 134/94 (BP Location: Right Arm, Patient Position: Sitting, Cuff Size: Normal)   Pulse 78   Temp 98.6 F (37 C) (Oral)   Resp 16   Ht 6' (1.829 m)   Wt 233 lb (105.7 kg)   SpO2 97%   BMI 31.60 kg/m    HENT:  Head: Normocephalic and atraumatic.  Right Ear: Hearing, tympanic membrane, external ear and ear canal normal.  Left Ear: Hearing, tympanic membrane, external ear and ear canal normal.  Nose: Nose normal.  Mouth/Throat: Uvula is midline, oropharynx is clear and moist and mucous membranes are normal. He does not have dentures. No oral lesions. No trismus in the jaw. Normal dentition. No dental abscesses, uvula swelling, lacerations or dental caries.  Eyes: Conjunctivae, EOM and lids are normal. Pupils are equal, round, and reactive to light. Right eye exhibits no discharge. Left eye exhibits no discharge. No scleral icterus.    Fundoscopic exam:      The right eye shows no arteriolar narrowing, no AV nicking, no exudate, no hemorrhage and no papilledema.       The left eye shows no arteriolar narrowing, no AV nicking, no exudate, no hemorrhage and no papilledema.  Neck: Normal range of motion, full passive range of motion without pain and phonation normal. Neck supple. No spinous process tenderness and no muscular tenderness present. No neck rigidity. No tracheal deviation, no edema, no erythema and normal range of motion present. No thyromegaly present.  Cardiovascular: Normal rate, regular rhythm, S1 normal, S2 normal, normal heart sounds, intact distal pulses and normal pulses.  Exam reveals no gallop and no friction rub.   No murmur heard. Pulmonary/Chest: Effort normal and breath sounds normal. No respiratory distress. He has no wheezes. He has no rales.  Abdominal: Soft. Normal appearance and bowel sounds are normal. He exhibits no distension and no mass. There is no hepatosplenomegaly. There is no  tenderness. There is no rebound and no guarding. No hernia.  Musculoskeletal: Normal range of motion. He exhibits no edema or tenderness.       Cervical back: Normal. He exhibits normal range of motion, no tenderness, no bony tenderness, no swelling, no edema, no deformity, no laceration, no pain, no spasm and normal pulse.       Thoracic back: Normal.       Lumbar back: Normal.  Lymphadenopathy:       Head (right side): No submental, no submandibular, no tonsillar, no preauricular, no posterior auricular and no occipital adenopathy present.       Head (left side): No submental, no submandibular, no tonsillar, no preauricular, no posterior auricular and no occipital adenopathy present.    He has no cervical adenopathy.       Right: No supraclavicular adenopathy present.       Left: No supraclavicular adenopathy present.  Neurological: He is alert and oriented to person, place, and time. He has normal strength and normal  reflexes. He displays no tremor. No cranial nerve deficit. He exhibits normal muscle tone. Coordination and gait normal.  Skin: Skin is warm, dry and intact. No abrasion, no ecchymosis, no laceration, no lesion and no rash noted. He is not diaphoretic. No cyanosis or erythema. No pallor. Nails show no clubbing.  Psychiatric: He has a normal mood and affect. His speech is normal and behavior is normal. Judgment and thought content normal. Cognition and memory are normal.           Assessment & Plan:   Problem List Items Addressed This Visit    Anxiety and depression (Chronic)    Well controlled. Continue current treatment.      Relevant Medications   citalopram (CELEXA) 20 MG tablet   HSV-2 infection    Well controlled. Continue current treatment.      Relevant Medications   valACYclovir (VALTREX) 500 MG tablet   BMI 31.0-31.9,adult    Encouraged continued active lifestyle and healthy eating choices.       Other Visit Diagnoses    Annual physical exam    -  Primary   Age appropriate health guidance provided.   Relevant Orders   Care order/instruction:   Care order/instruction:   Screening for blood or protein in urine       Relevant Orders   POCT urinalysis dipstick (Completed)   Screening for deficiency anemia       Relevant Orders   CBC with Differential/Platelet   Screening for metabolic disorder       Relevant Orders   Comprehensive metabolic panel   Screening for hyperlipidemia       Relevant Orders   Lipid panel   Restless legs       Relevant Medications   rOPINIRole (REQUIP XL) 2 MG 24 hr tablet   Chronic pain of both knees       Relevant Medications   meloxicam (MOBIC) 15 MG tablet   citalopram (CELEXA) 20 MG tablet   Cough       Relevant Medications   albuterol (PROVENTIL HFA;VENTOLIN HFA) 108 (90 Base) MCG/ACT inhaler   Screening for thyroid disorder       Relevant Orders   TSH   Elevated blood pressure reading       re-evaluate in 1 month. If  remains elevated, plan to initiate anti-hypertensive treatment.       Return in about 1 year (around 12/03/2017) for Wellness Exam, 4 weeks for re-evalaution of  blood pressure.   Fernande Bras, PA-C [facility]

## 2016-12-03 NOTE — Assessment & Plan Note (Signed)
Encouraged continued active lifestyle and healthy eating choices.

## 2016-12-04 LAB — COMPREHENSIVE METABOLIC PANEL
A/G RATIO: 1.6 (ref 1.2–2.2)
ALBUMIN: 4.5 g/dL (ref 3.5–5.5)
ALT: 34 IU/L (ref 0–44)
AST: 25 IU/L (ref 0–40)
Alkaline Phosphatase: 54 IU/L (ref 39–117)
BILIRUBIN TOTAL: 1.1 mg/dL (ref 0.0–1.2)
BUN / CREAT RATIO: 10 (ref 9–20)
BUN: 14 mg/dL (ref 6–24)
CHLORIDE: 100 mmol/L (ref 96–106)
CO2: 27 mmol/L (ref 20–29)
Calcium: 9.3 mg/dL (ref 8.7–10.2)
Creatinine, Ser: 1.36 mg/dL — ABNORMAL HIGH (ref 0.76–1.27)
GFR calc non Af Amer: 62 mL/min/{1.73_m2} (ref >59)
GFR, EST AFRICAN AMERICAN: 71 mL/min/{1.73_m2} (ref >59)
GLOBULIN, TOTAL: 2.9 g/dL (ref 1.5–4.5)
Glucose: 97 mg/dL (ref 65–99)
POTASSIUM: 4.2 mmol/L (ref 3.5–5.2)
SODIUM: 140 mmol/L (ref 134–144)
TOTAL PROTEIN: 7.4 g/dL (ref 6.0–8.5)

## 2016-12-04 LAB — CBC WITH DIFFERENTIAL/PLATELET
BASOS: 1 %
Basophils Absolute: 0.1 10*3/uL (ref 0.0–0.2)
EOS (ABSOLUTE): 0.2 10*3/uL (ref 0.0–0.4)
EOS: 2 %
HEMATOCRIT: 42.8 % (ref 37.5–51.0)
HEMOGLOBIN: 15.1 g/dL (ref 13.0–17.7)
IMMATURE GRANS (ABS): 0 10*3/uL (ref 0.0–0.1)
Immature Granulocytes: 0 %
LYMPHS: 33 %
Lymphocytes Absolute: 2.5 10*3/uL (ref 0.7–3.1)
MCH: 33 pg (ref 26.6–33.0)
MCHC: 35.3 g/dL (ref 31.5–35.7)
MCV: 93 fL (ref 79–97)
Monocytes Absolute: 0.4 10*3/uL (ref 0.1–0.9)
Monocytes: 5 %
NEUTROS ABS: 4.4 10*3/uL (ref 1.4–7.0)
Neutrophils: 59 %
PLATELETS: 143 10*3/uL — AB (ref 150–379)
RBC: 4.58 x10E6/uL (ref 4.14–5.80)
RDW: 14.7 % (ref 12.3–15.4)
WBC: 7.6 10*3/uL (ref 3.4–10.8)

## 2016-12-04 LAB — LIPID PANEL
CHOL/HDL RATIO: 4.4 ratio (ref 0.0–5.0)
Cholesterol, Total: 129 mg/dL (ref 100–199)
HDL: 29 mg/dL — ABNORMAL LOW (ref >39)
LDL Calculated: 71 mg/dL (ref 0–99)
Triglycerides: 147 mg/dL (ref 0–149)
VLDL Cholesterol Cal: 29 mg/dL (ref 5–40)

## 2016-12-04 LAB — TSH: TSH: 3.23 u[IU]/mL (ref 0.450–4.500)

## 2016-12-06 NOTE — Addendum Note (Signed)
Addended by: Fernande BrasJEFFERY, Raylan Troiani S on: 12/06/2016 09:27 AM   Modules accepted: Orders

## 2017-08-27 ENCOUNTER — Encounter: Payer: Self-pay | Admitting: Physician Assistant

## 2017-10-23 ENCOUNTER — Other Ambulatory Visit: Payer: Self-pay | Admitting: Physician Assistant

## 2017-10-23 DIAGNOSIS — F329 Major depressive disorder, single episode, unspecified: Secondary | ICD-10-CM

## 2017-10-23 DIAGNOSIS — B009 Herpesviral infection, unspecified: Secondary | ICD-10-CM

## 2017-10-23 DIAGNOSIS — F419 Anxiety disorder, unspecified: Secondary | ICD-10-CM

## 2017-10-23 DIAGNOSIS — F32A Depression, unspecified: Secondary | ICD-10-CM

## 2017-10-26 NOTE — Telephone Encounter (Signed)
[facility] called and spoke to Sprint Nextel CorporationKim, Pharmacologistharmacy Technician. I asked if she could check to see if the patient is due refills, because according when the prescription was sent on 12/03/16 90 tabs/3 refills, he should have refills left on Citalopram and Valacyclovir. Kevin West says the patient was picking up the Valacyclovir early and he has no more refills maybe because he was taking more than 1 per day if he had a breakout. She also says the Citalopram has no more refills, but there is not reason why, if he's taking 1 per day as ordered. I advised I will call the patient to clarify his refill request. Patient called and he says "I have enough Citalopram, but the Valacyclovir I will need because I took extra when I had an outbreak." I advised this will be sent to the pharmacy and to remember to call and schedule the yearly visit that is coming due in July. He says he will follow Kevin West at the new office and will call to schedule an appointment.

## 2017-12-11 DIAGNOSIS — Z1389 Encounter for screening for other disorder: Secondary | ICD-10-CM | POA: Diagnosis not present

## 2017-12-11 DIAGNOSIS — Z130 Encounter for screening for diseases of the blood and blood-forming organs and certain disorders involving the immune mechanism: Secondary | ICD-10-CM | POA: Diagnosis not present

## 2017-12-11 DIAGNOSIS — Z1329 Encounter for screening for other suspected endocrine disorder: Secondary | ICD-10-CM | POA: Diagnosis not present

## 2017-12-11 DIAGNOSIS — Z13220 Encounter for screening for lipoid disorders: Secondary | ICD-10-CM | POA: Diagnosis not present

## 2017-12-11 DIAGNOSIS — Z125 Encounter for screening for malignant neoplasm of prostate: Secondary | ICD-10-CM | POA: Diagnosis not present

## 2017-12-11 DIAGNOSIS — B009 Herpesviral infection, unspecified: Secondary | ICD-10-CM | POA: Diagnosis not present

## 2017-12-11 DIAGNOSIS — Z0000 Encounter for general adult medical examination without abnormal findings: Secondary | ICD-10-CM | POA: Diagnosis not present

## 2017-12-11 DIAGNOSIS — Z6831 Body mass index (BMI) 31.0-31.9, adult: Secondary | ICD-10-CM | POA: Diagnosis not present

## 2017-12-11 DIAGNOSIS — Z13228 Encounter for screening for other metabolic disorders: Secondary | ICD-10-CM | POA: Diagnosis not present

## 2020-05-23 ENCOUNTER — Emergency Department (HOSPITAL_COMMUNITY): Payer: BC Managed Care – PPO

## 2020-05-23 ENCOUNTER — Other Ambulatory Visit: Payer: Self-pay

## 2020-05-23 ENCOUNTER — Encounter (HOSPITAL_COMMUNITY): Payer: Self-pay

## 2020-05-23 ENCOUNTER — Inpatient Hospital Stay (HOSPITAL_COMMUNITY)
Admission: EM | Admit: 2020-05-23 | Discharge: 2020-05-27 | DRG: 177 | Disposition: A | Discharge status: Home / Self Care | Payer: BC Managed Care – PPO | Attending: Internal Medicine | Admitting: Internal Medicine

## 2020-05-23 DIAGNOSIS — E871 Hypo-osmolality and hyponatremia: Secondary | ICD-10-CM | POA: Diagnosis present

## 2020-05-23 DIAGNOSIS — J1282 Pneumonia due to coronavirus disease 2019: Secondary | ICD-10-CM | POA: Diagnosis not present

## 2020-05-23 DIAGNOSIS — J9601 Acute respiratory failure with hypoxia: Secondary | ICD-10-CM | POA: Diagnosis present

## 2020-05-23 DIAGNOSIS — Z791 Long term (current) use of non-steroidal anti-inflammatories (NSAID): Secondary | ICD-10-CM | POA: Diagnosis not present

## 2020-05-23 DIAGNOSIS — E860 Dehydration: Secondary | ICD-10-CM | POA: Diagnosis present

## 2020-05-23 DIAGNOSIS — Z91011 Allergy to milk products: Secondary | ICD-10-CM

## 2020-05-23 DIAGNOSIS — R030 Elevated blood-pressure reading, without diagnosis of hypertension: Secondary | ICD-10-CM | POA: Diagnosis present

## 2020-05-23 DIAGNOSIS — E669 Obesity, unspecified: Secondary | ICD-10-CM | POA: Diagnosis present

## 2020-05-23 DIAGNOSIS — Z91010 Allergy to peanuts: Secondary | ICD-10-CM

## 2020-05-23 DIAGNOSIS — U071 COVID-19: Secondary | ICD-10-CM

## 2020-05-23 DIAGNOSIS — Z8249 Family history of ischemic heart disease and other diseases of the circulatory system: Secondary | ICD-10-CM

## 2020-05-23 DIAGNOSIS — R7401 Elevation of levels of liver transaminase levels: Secondary | ICD-10-CM | POA: Diagnosis not present

## 2020-05-23 DIAGNOSIS — F32A Depression, unspecified: Secondary | ICD-10-CM | POA: Diagnosis present

## 2020-05-23 DIAGNOSIS — E875 Hyperkalemia: Secondary | ICD-10-CM | POA: Diagnosis present

## 2020-05-23 DIAGNOSIS — Z823 Family history of stroke: Secondary | ICD-10-CM | POA: Diagnosis not present

## 2020-05-23 DIAGNOSIS — R0602 Shortness of breath: Secondary | ICD-10-CM | POA: Diagnosis not present

## 2020-05-23 DIAGNOSIS — Z79899 Other long term (current) drug therapy: Secondary | ICD-10-CM

## 2020-05-23 DIAGNOSIS — Z6833 Body mass index (BMI) 33.0-33.9, adult: Secondary | ICD-10-CM

## 2020-05-23 DIAGNOSIS — Z809 Family history of malignant neoplasm, unspecified: Secondary | ICD-10-CM

## 2020-05-23 DIAGNOSIS — R0902 Hypoxemia: Secondary | ICD-10-CM

## 2020-05-23 LAB — CBC WITH DIFFERENTIAL/PLATELET
Abs Immature Granulocytes: 0.04 10*3/uL (ref 0.00–0.07)
Basophils Absolute: 0 10*3/uL (ref 0.0–0.1)
Basophils Relative: 0 %
Eosinophils Absolute: 0 10*3/uL (ref 0.0–0.5)
Eosinophils Relative: 0 %
HCT: 35.5 % — ABNORMAL LOW (ref 39.0–52.0)
Hemoglobin: 12.6 g/dL — ABNORMAL LOW (ref 13.0–17.0)
Immature Granulocytes: 0 %
Lymphocytes Relative: 13 %
Lymphs Abs: 1.5 10*3/uL (ref 0.7–4.0)
MCH: 32.1 pg (ref 26.0–34.0)
MCHC: 35.5 g/dL (ref 30.0–36.0)
MCV: 90.3 fL (ref 80.0–100.0)
Monocytes Absolute: 0.5 10*3/uL (ref 0.1–1.0)
Monocytes Relative: 5 %
Neutro Abs: 9 10*3/uL — ABNORMAL HIGH (ref 1.7–7.7)
Neutrophils Relative %: 82 %
Platelets: 208 10*3/uL (ref 150–400)
RBC: 3.93 MIL/uL — ABNORMAL LOW (ref 4.22–5.81)
RDW: 14.6 % (ref 11.5–15.5)
WBC: 11 10*3/uL — ABNORMAL HIGH (ref 4.0–10.5)
nRBC: 0 % (ref 0.0–0.2)

## 2020-05-23 LAB — COMPREHENSIVE METABOLIC PANEL
ALT: 101 U/L — ABNORMAL HIGH (ref 0–44)
AST: 84 U/L — ABNORMAL HIGH (ref 15–41)
Albumin: 3.7 g/dL (ref 3.5–5.0)
Alkaline Phosphatase: 113 U/L (ref 38–126)
Anion gap: 9 (ref 5–15)
BUN: 16 mg/dL (ref 6–20)
CO2: 27 mmol/L (ref 22–32)
Calcium: 8.4 mg/dL — ABNORMAL LOW (ref 8.9–10.3)
Chloride: 95 mmol/L — ABNORMAL LOW (ref 98–111)
Creatinine, Ser: 1.3 mg/dL — ABNORMAL HIGH (ref 0.61–1.24)
GFR, Estimated: >60 mL/min (ref >60)
Glucose, Bld: 142 mg/dL — ABNORMAL HIGH (ref 70–99)
Potassium: 4.3 mmol/L (ref 3.5–5.1)
Sodium: 131 mmol/L — ABNORMAL LOW (ref 135–145)
Total Bilirubin: 1.5 mg/dL — ABNORMAL HIGH (ref 0.3–1.2)
Total Protein: 7.5 g/dL (ref 6.5–8.1)

## 2020-05-23 LAB — FIBRINOGEN: Fibrinogen: 756 mg/dL — ABNORMAL HIGH (ref 210–475)

## 2020-05-23 LAB — FERRITIN: Ferritin: 2634 ng/mL — ABNORMAL HIGH (ref 24–336)

## 2020-05-23 LAB — TROPONIN I (HIGH SENSITIVITY): Troponin I (High Sensitivity): 14 ng/L (ref <18)

## 2020-05-23 LAB — LACTIC ACID, PLASMA: Lactic Acid, Venous: 1.1 mmol/L (ref 0.5–1.9)

## 2020-05-23 LAB — POC SARS CORONAVIRUS 2 AG -  ED: SARS Coronavirus 2 Ag: NEGATIVE

## 2020-05-23 LAB — RESP PANEL BY RT-PCR (FLU A&B, COVID) ARPGX2
Influenza A by PCR: NEGATIVE
Influenza B by PCR: NEGATIVE
SARS Coronavirus 2 by RT PCR: POSITIVE — AB

## 2020-05-23 LAB — D-DIMER, QUANTITATIVE: D-Dimer, Quant: 0.66 ug/mL-FEU — ABNORMAL HIGH (ref 0.00–0.50)

## 2020-05-23 LAB — PROCALCITONIN: Procalcitonin: 0.62 ng/mL

## 2020-05-23 LAB — BRAIN NATRIURETIC PEPTIDE: B Natriuretic Peptide: 37.7 pg/mL (ref 0.0–100.0)

## 2020-05-23 LAB — C-REACTIVE PROTEIN: CRP: 23.8 mg/dL — ABNORMAL HIGH (ref <1.0)

## 2020-05-23 LAB — TRIGLYCERIDES: Triglycerides: 183 mg/dL — ABNORMAL HIGH (ref <150)

## 2020-05-23 LAB — LACTATE DEHYDROGENASE: LDH: 326 U/L — ABNORMAL HIGH (ref 98–192)

## 2020-05-23 MED ORDER — ONDANSETRON HCL 4 MG/2ML IJ SOLN
4.0000 mg | Freq: Once | INTRAMUSCULAR | Status: AC
  Administered 2020-05-23: 4 mg via INTRAVENOUS
  Filled 2020-05-23: qty 2

## 2020-05-23 MED ORDER — SODIUM CHLORIDE 0.9 % IV SOLN
100.0000 mg | Freq: Every day | INTRAVENOUS | Status: AC
  Administered 2020-05-24 – 2020-05-27 (×4): 100 mg via INTRAVENOUS
  Filled 2020-05-23 (×4): qty 20

## 2020-05-23 MED ORDER — SODIUM CHLORIDE 0.9 % IV BOLUS
500.0000 mL | Freq: Once | INTRAVENOUS | Status: AC
  Administered 2020-05-23: 500 mL via INTRAVENOUS

## 2020-05-23 MED ORDER — IOHEXOL 350 MG/ML SOLN
100.0000 mL | Freq: Once | INTRAVENOUS | Status: AC | PRN
  Administered 2020-05-24: 100 mL via INTRAVENOUS

## 2020-05-23 MED ORDER — SODIUM CHLORIDE 0.9 % IV SOLN
200.0000 mg | Freq: Once | INTRAVENOUS | Status: AC
  Administered 2020-05-24: 200 mg via INTRAVENOUS
  Filled 2020-05-23: qty 200

## 2020-05-23 MED ORDER — DEXAMETHASONE SODIUM PHOSPHATE 10 MG/ML IJ SOLN
10.0000 mg | Freq: Once | INTRAMUSCULAR | Status: AC
  Administered 2020-05-23: 10 mg via INTRAVENOUS
  Filled 2020-05-23: qty 1

## 2020-05-23 MED ORDER — KETOROLAC TROMETHAMINE 15 MG/ML IJ SOLN
15.0000 mg | Freq: Once | INTRAMUSCULAR | Status: AC
  Administered 2020-05-23: 15 mg via INTRAVENOUS
  Filled 2020-05-23: qty 1

## 2020-05-23 NOTE — ED Notes (Signed)
Pt oxygen sat was 77% RA. Pt currently 94% on 6L [place].

## 2020-05-23 NOTE — ED Triage Notes (Signed)
Pt BIB EMS home. Pt COVID+ last Thursday 12/23. Pt c/o worsening SOB starting yesterday. EMS reports 80% on RA, EMS put pt on nonrebreather sat 97% on 10L.  Pt c/o body aches, fever.

## 2020-05-23 NOTE — H&P (Signed)
History and Physical    Kevin West FYB:[id number] DOB: 1970-06-15 DOA: 05/23/2020  PCP: System, Provider Not In   Patient coming from:  Home  Chief Complaint: SOB, cough  HPI: Kevin West is a 49 y.o. male with medical history significant for depression presents for evaluation of SOB, abdominal pain for past week. He took a home COVID-19 test a week ago that was positive. He has had progressive shortness of breath, abdominal cramps, fatigue and malaise with body aches for the past week.  He reports that for the last 24 hours had increasing shortness of breath even with minimal exertion.  He has had a progressive cough with occasional intermittent clear phlegm.  States he has some central chest pain with hard coughing but not any other time.  He reports having fevers to 102 degrees at home.  He has used Tylenol at home for his symptoms.  With the increasing shortness of breath he decided come for evaluation.  He reports his wife was also positive for Covid a week ago but she is not as severe as he is.  He has not received any monoclonal antibody therapy as an outpatient.  In the emergency room patient has had episodes of hypoxia requiring supplemental oxygen by nasal cannula.  Chest x-ray is consistent with Covid pneumonia.  Covid labs are elevated.  Review of Systems:  General: Denies weakness, weight loss, night sweats.  Denies dizziness.  Denies change in appetite HENT: Denies head trauma, headache, denies change in hearing, tinnitus.  Denies nasal congestion or bleeding.  Denies sore throat, sores in mouth.  Denies difficulty swallowing Eyes: Denies blurry vision, pain in eye, drainage.  Denies discoloration of eyes. Neck: Denies pain.  Denies swelling.  Denies pain with movement. Cardiovascular: Denies chest pain, palpitations.  Denies edema.  Denies orthopnea Respiratory: Reports shortness of breath with cough.  Denies wheezing.  Denies sputum production Gastrointestinal: Reports diffuse  abdominal cramps and pain Reports nausea and diarrhea. No vomiting. Denies swelling. Denies melena.  Denies hematemesis. Musculoskeletal: Denies limitation of movement.  Denies deformity or swelling.  Denies pain.  Denies arthralgias or myalgias. Genitourinary: Denies pelvic pain.  Denies urinary frequency or hesitancy.  Denies dysuria.  Skin: Denies rash.  Denies petechiae, purpura, ecchymosis. Neurological: Denies headache.  Denies syncope.  Denies seizure activity.  Denies weakness or paresthesia.  Denies slurred speech, drooping face.  Denies visual change. Psychiatric: Denies depression, anxiety.  Denies suicidal thoughts or ideation.  Denies hallucinations.  Past Medical History:  Diagnosis Date  . Allergy   . Anxiety   . Depression   . HSV-2 infection     Past Surgical History:  Procedure Laterality Date  . HERNIA REPAIR    . NASAL SINUS SURGERY    . NOSE SURGERY     fracture  . VASECTOMY  2000    Social History  reports that he has never smoked. He has never used smokeless tobacco. He reports that he does not drink alcohol and does not use drugs.  Allergies  Allergen Reactions  . Milk-Related Compounds Diarrhea  . Peanut-Containing Drug Products Other (See Comments)    Triggers HSV outbreak    Family History  Problem Relation Age of Onset  . Hypertension Mother   . Stroke Mother 39  . Cancer Sister   . Cancer Maternal Grandfather   . Cancer Sister        skin     Prior to Admission medications   Medication Sig Start Date End Date  Taking? Authorizing Provider  albuterol (PROVENTIL HFA;VENTOLIN HFA) 108 (90 Base) MCG/ACT inhaler Inhale 2 puffs into the lungs every 4 (four) hours as needed for wheezing or shortness of breath (cough, shortness of breath or wheezing.). 12/03/16  Yes Jeffery, Chelle, PA  citalopram (CELEXA) 40 MG tablet Take 40 mg by mouth daily. 03/16/20  Yes [provider]  meloxicam (MOBIC) 15 MG tablet Take 1 tablet (15 mg total) by  mouth daily. 12/03/16  Yes Jeffery, Chelle, PA  valACYclovir (VALTREX) 1000 MG tablet Take 1,000 mg by mouth daily. 04/23/20  Yes [provider]    Physical Exam: Vitals:   05/23/20 2130 05/23/20 2200 05/23/20 2234 05/23/20 2300  BP: (!) 138/91 130/73  (!) 141/85  Pulse: 89 88  84  Resp: (!) 32 (!) 29  (!) 30  Temp:   98.7 F (37.1 C)   TempSrc:   Oral   SpO2: 96% 97%  96%    Constitutional: NAD, calm, comfortable Vitals:   05/23/20 2130 05/23/20 2200 05/23/20 2234 05/23/20 2300  BP: (!) 138/91 130/73  (!) 141/85  Pulse: 89 88  84  Resp: (!) 32 (!) 29  (!) 30  Temp:   98.7 F (37.1 C)   TempSrc:   Oral   SpO2: 96% 97%  96%   General: WDWN, Alert and oriented x3.  Eyes: EOMI, PERRL, conjunctivae normal.  Sclera nonicteric HENT:  [place]/AT, external ears normal. Nares patent without epistasis. Mucous membranes are moist. Posterior pharynx clear of any exudate or lesions.  Neck: Soft, normal range of motion, supple, no masses, no thyromegaly. Trachea midline Respiratory: clear to auscultation bilaterally, no wheezing, no crackles. Normal respiratory effort. No accessory muscle use.  Cardiovascular: Regular rate and rhythm, no murmurs / rubs / gallops. No extremity edema. 2+ pedal pulses.  Abdomen: Soft, no tenderness, nondistended, no rebound or guarding.  No masses palpated. No HSM. Bowel sounds normoactive Musculoskeletal: FROM. no clubbing / cyanosis. No joint deformity upper and lower extremities. Normal muscle tone.  Skin: Warm, dry, intact no rashes, lesions, ulcers. No induration Neurologic: CN 2-12 grossly intact.  Normal speech.  Sensation intact. Strength 5/5 in all extremities.   Psychiatric: Normal judgment and insight.  Normal mood.    Labs on Admission: I have personally reviewed following labs and imaging studies  CBC: Recent Labs  Lab 05/23/20 2100  WBC 11.0*  NEUTROABS 9.0*  HGB 12.6*  HCT 35.5*  MCV 90.3  PLT 208    Basic Metabolic  Panel: Recent Labs  Lab 05/23/20 2100  NA 131*  K 4.3  CL 95*  CO2 27  GLUCOSE 142*  BUN 16  CREATININE 1.30*  CALCIUM 8.4*    GFR: CrCl cannot be calculated (Unknown ideal weight.).  Liver Function Tests: Recent Labs  Lab 05/23/20 2100  AST 84*  ALT 101*  ALKPHOS 113  BILITOT 1.5*  PROT 7.5  ALBUMIN 3.7    Urine analysis:    Component Value Date/Time   BILIRUBINUR negative 12/03/2016 0905   BILIRUBINUR neg 09/29/2013 1439   KETONESUR negative 12/03/2016 0905   PROTEINUR trace (A) 12/03/2016 0905   PROTEINUR neg 09/29/2013 1439   UROBILINOGEN 1.0 12/03/2016 0905   NITRITE Negative 12/03/2016 0905   NITRITE neg 09/29/2013 1439   LEUKOCYTESUR Negative 12/03/2016 0905    Radiological Exams on Admission: DG Chest Port 1 View  Result Date: 05/23/2020 CLINICAL DATA:  COVID pneumonia short of breath EXAM: PORTABLE CHEST 1 VIEW COMPARISON:  02/01/2011 FINDINGS: Diffuse bilateral  streaky and ground-glass opacity left greater than right. No pleural effusion. Normal cardiomediastinal silhouette. No pneumothorax IMPRESSION: Diffuse left greater than right bilateral streaky and ground-glass opacity, consistent with pneumonia Electronically Signed   By: Jasmine Pang M.D.   On: 05/23/2020 20:52    EKG: Independently reviewed.  EKG shows normal sinus rhythm with no acute ST elevation or depression.  QTc 447  Assessment/Plan Principal Problem:   Pneumonia due to COVID-19 virus Kevin West is admitted to MedSurg floor under COVID-19 the protocols and precautions. Continued on supplemental oxygen by nasal cannula to keep O2 sat between 92 to 96%. Albuterol MDI with spacer as needed as needed for shortness of breath, wheezing. Patient started on remdesivir and Solu-Medrol for COVID-19 infection Supportive care provided Neck CT angiography with elevated D-dimer level and shortness of breath requiring supplemental oxygen.  Covid is a prothrombotic state and has elevated risk of  thromboembolic disease and PE to make decision on appropriate anticoagulation dose and duration.  Active Problems:   Hypoxia Supplemental oxygen as above.  Incentive spirometer every 2 hours while awake.   DVT prophylaxis: Lovenox for DVT prophylaxis. Will convert to therapeutic anticoagulation if PE is identified on CT angiography Code Status:   Full code Family Communication:  Diagnosis and plan discussed with patient.  Patient verbalized understanding and agrees with plan.  Further recommendations to follow as clinically indicated Disposition Plan:   Patient is from:  Home  Anticipated DC to:  Home  Anticipated DC date:  Anticipate at least two midnight hospital stay to treat acute medical condition  Anticipated DC barriers: No barriers to discharge identified at this time  Admission status:  Inpatient   Claudean Severance Bharath Bernstein MD [facility]  How to contact the [facility] Attending or Consulting provider 7A - 7P or covering provider during after hours 7P -7A, for this patient?   1. Check the care team in [facility] and look for a) attending/consulting [facility] provider listed and b) the [facility] team listed 2. Log into [email or web address] and use [place] universal password to access. If you do not have the password, please contact the hospital operator. 3. Locate the [facility] provider you are looking for under [facility] and page to a number that you can be directly reached. 4. If you still have difficulty reaching the provider, please page the [facility] (Director on Call) for the Hospitalists listed on amion for assistance.  05/23/2020, 11:31 PM

## 2020-05-23 NOTE — ED Provider Notes (Addendum)
[facility]-EMERGENCY DEPT Provider Note   CSN: [id number] Arrival date & time: 05/23/20  2012     History Chief Complaint  Patient presents with  . Covid Positive  . Shortness of Breath    Kevin West is a 49 y.o. male.  Presents here with concern for shortness of breath.  Symptoms of Covid since Thursday.  Initial mild flulike illness.  Described general fatigue, malaise and body aches.  Then has had progressive cough and shortness of breath.  Sometimes has chest pains, not currently having any pain.  Pain associated with coughing.  Has had fevers at home, unsure what temperature, has been taking Tylenol.  Last dose of Tylenol around 5 hours ago.  Unvaccinated.  Denies medical problems.  HPI     Past Medical History:  Diagnosis Date  . Allergy   . Anxiety   . Depression   . HSV-2 infection     Patient Active Problem List   Diagnosis Date Noted  . BMI 31.0-31.9,adult 12/04/2015  . Hemorrhoids 02/12/2015  . Diarrhea 02/12/2015  . Sinus congestion 10/11/2013  . 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 Anxiety and depression 09/16/2011    Past Surgical History:  Procedure Laterality Date  . HERNIA REPAIR    . NASAL SINUS SURGERY    . NOSE SURGERY     fracture  . VASECTOMY  2000       Family History  Problem Relation Age of Onset  . Hypertension Mother   . Stroke Mother 64  . Cancer Sister   . Cancer Maternal Grandfather   . Cancer Sister        skin    Social History   Tobacco Use  . Smoking status: Never Smoker  . Smokeless tobacco: Never Used  Substance Use Topics  . Alcohol use: No  . Drug use: No    Home Medications Prior to Admission medications   Medication Sig Start Date End Date Taking? Authorizing Provider  albuterol (PROVENTIL HFA;VENTOLIN HFA) 108 (90 Base) MCG/ACT inhaler Inhale 2 puffs into the lungs every 4 (four) hours as needed for wheezing or shortness of breath (cough, shortness of breath or wheezing.). 12/03/16    Porfirio Oar, PA  citalopram (CELEXA) 20 MG tablet TAKE 1 TABLET BY MOUTH ONCE DAILY 10/26/17   Porfirio Oar, PA  meloxicam (MOBIC) 15 MG tablet Take 1 tablet (15 mg total) by mouth daily. 12/03/16   Porfirio Oar, PA  rOPINIRole (REQUIP XL) 2 MG 24 hr tablet Take 1 tablet (2 mg total) by mouth at bedtime. 12/03/16   Porfirio Oar, PA  valACYclovir (VALTREX) 500 MG tablet TAKE 1 TABLET BY MOUTH ONCE DAILY 10/26/17   Porfirio Oar, PA    Allergies    Milk-related compounds and Peanut-containing drug products  Review of Systems   Review of Systems  Constitutional: Positive for chills, fatigue and fever.  HENT: Negative for ear pain and sore throat.   Eyes: Negative for pain and visual disturbance.  Respiratory: Positive for cough and shortness of breath.   Cardiovascular: Positive for chest pain. Negative for palpitations.  Gastrointestinal: Negative for abdominal pain and vomiting.  Genitourinary: Negative for dysuria and hematuria.  Musculoskeletal: Positive for arthralgias and myalgias. Negative for back pain.  Skin: Negative for color change and rash.  Neurological: Negative for seizures and syncope.  All other systems reviewed and are negative.   Physical Exam Updated Vital Signs BP 130/73   Pulse 88   Temp 99.8 F (37.7 C) (Oral)  Resp (!) 29   SpO2 97%   Physical Exam Vitals and nursing note reviewed.  Constitutional:      Appearance: He is well-developed and well-nourished.  HENT:     Head: Normocephalic and atraumatic.  Eyes:     Conjunctiva/sclera: Conjunctivae normal.  Cardiovascular:     Rate and Rhythm: Regular rhythm. Tachycardia present.     Heart sounds: No murmur heard.   Pulmonary:     Comments: Mild tachypnea, speaks in full sentences, not in distress Abdominal:     Palpations: Abdomen is soft.     Tenderness: There is no abdominal tenderness.  Musculoskeletal:        General: No edema.     Cervical back: Neck supple.     Right lower leg:  No edema.     Left lower leg: No edema.  Skin:    General: Skin is warm and dry.     Capillary Refill: Capillary refill takes less than 2 seconds.  Neurological:     General: No focal deficit present.     Mental Status: He is alert.  Psychiatric:        Mood and Affect: Mood and affect and mood normal.        Behavior: Behavior normal.     ED Results / Procedures / Treatments   Labs (all labs ordered are listed, but only abnormal results are displayed) Labs Reviewed  CBC WITH DIFFERENTIAL/PLATELET - Abnormal; Notable for the following components:      Result Value   WBC 11.0 (*)    RBC 3.93 (*)    Hemoglobin 12.6 (*)    HCT 35.5 (*)    Neutro Abs 9.0 (*)    All other components within normal limits  COMPREHENSIVE METABOLIC PANEL - Abnormal; Notable for the following components:   Sodium 131 (*)    Chloride 95 (*)    Glucose, Bld 142 (*)    Creatinine, Ser 1.30 (*)    Calcium 8.4 (*)    AST 84 (*)    ALT 101 (*)    Total Bilirubin 1.5 (*)    All other components within normal limits  D-DIMER, QUANTITATIVE (NOT AT [facility]) - Abnormal; Notable for the following components:   D-Dimer, Quant 0.66 (*)    All other components within normal limits  LACTATE DEHYDROGENASE - Abnormal; Notable for the following components:   LDH 326 (*)    All other components within normal limits  TRIGLYCERIDES - Abnormal; Notable for the following components:   Triglycerides 183 (*)    All other components within normal limits  FIBRINOGEN - Abnormal; Notable for the following components:   Fibrinogen 756 (*)    All other components within normal limits  CULTURE, BLOOD (ROUTINE X 2)  CULTURE, BLOOD (ROUTINE X 2)  RESP PANEL BY RT-PCR (FLU A&B, COVID) ARPGX2  LACTIC ACID, PLASMA  BRAIN NATRIURETIC PEPTIDE  PROCALCITONIN  FERRITIN  C-REACTIVE PROTEIN  POC SARS CORONAVIRUS 2 AG -  ED  TROPONIN I (HIGH SENSITIVITY)    EKG None  Radiology DG Chest Port 1 View  Result Date:  05/23/2020 CLINICAL DATA:  COVID pneumonia short of breath EXAM: PORTABLE CHEST 1 VIEW COMPARISON:  02/01/2011 FINDINGS: Diffuse bilateral streaky and ground-glass opacity left greater than right. No pleural effusion. Normal cardiomediastinal silhouette. No pneumothorax IMPRESSION: Diffuse left greater than right bilateral streaky and ground-glass opacity, consistent with pneumonia Electronically Signed   By: Jasmine Pang M.D.   On: 05/23/2020 20:52  Procedures .Critical Care Performed by: Milagros Loll, MD Authorized by: Milagros Loll, MD   Critical care provider statement:    Critical care time (minutes):  42   Critical care was necessary to treat or prevent imminent or life-threatening deterioration of the following conditions:  Respiratory failure   Critical care was time spent personally by me on the following activities:  Discussions with consultants, evaluation of patient's response to treatment, examination of patient, ordering and performing treatments and interventions, ordering and review of laboratory studies, ordering and review of radiographic studies, pulse oximetry, re-evaluation of patient's condition, obtaining history from patient or surrogate and review of old charts   (including critical care time)  Medications Ordered in ED Medications  dexamethasone (DECADRON) injection 10 mg (has no administration in time range)  sodium chloride 0.9 % bolus 500 mL (has no administration in time range)  ketorolac (TORADOL) 15 MG/ML injection 15 mg (15 mg Intravenous Given 05/23/20 2129)  ondansetron (ZOFRAN) injection 4 mg (4 mg Intravenous Given 05/23/20 2128)    ED Course  I have reviewed the triage vital signs and the nursing notes.  Pertinent labs & imaging results that were available during my care of the patient were reviewed by me and considered in my medical decision making (see chart for details).    MDM Rules/Calculators/A&P                          49 year old male presenting to ER with concern for worsening shortness of breath in setting of known COVID-19.  On exam patient somewhat tachypneic, hypoxic on room air.  Currently on 5 L nasal cannula.  CXR consistent with Covid pneumonia.  At home was positive last week. Antigen negative. Will send PCR which expect to be positive. Will admit to hospitalist, start steroids.  Once pcr is positive, will order remdesivir.  Mccartney Chuba was evaluated in Emergency Department on 05/23/2020 for the symptoms described in the history of present illness. He was evaluated in the context of the global COVID-19 pandemic, which necessitated consideration that the patient might be at risk for infection with the SARS-CoV-2 virus that causes COVID-19. Institutional protocols and algorithms that pertain to the evaluation of patients at risk for COVID-19 are in a state of rapid change based on information released by regulatory bodies including the CDC and federal and state organizations. These policies and algorithms were followed during the patient's care in the ED.  Final Clinical Impression(s) / ED Diagnoses Final diagnoses:  COVID-19  Acute respiratory failure with hypoxia (HCC)  Pneumonia due to COVID-19 virus    Rx / DC Orders ED Discharge Orders    None       Milagros Loll, MD 05/23/20 2224   ADDENDUM - Dr. Lewis Moccasin will accept to [facility]. He has requested a CTA to r/o PE.   Milagros Loll, MD 05/23/20 2239

## 2020-05-24 ENCOUNTER — Inpatient Hospital Stay (HOSPITAL_COMMUNITY): Payer: BC Managed Care – PPO

## 2020-05-24 ENCOUNTER — Encounter (HOSPITAL_COMMUNITY): Payer: Self-pay | Admitting: Family Medicine

## 2020-05-24 DIAGNOSIS — J9601 Acute respiratory failure with hypoxia: Secondary | ICD-10-CM

## 2020-05-24 DIAGNOSIS — R7401 Elevation of levels of liver transaminase levels: Secondary | ICD-10-CM

## 2020-05-24 DIAGNOSIS — E871 Hypo-osmolality and hyponatremia: Secondary | ICD-10-CM

## 2020-05-24 LAB — CBC
HCT: 34.9 % — ABNORMAL LOW (ref 39.0–52.0)
Hemoglobin: 12.2 g/dL — ABNORMAL LOW (ref 13.0–17.0)
MCH: 31.8 pg (ref 26.0–34.0)
MCHC: 35 g/dL (ref 30.0–36.0)
MCV: 90.9 fL (ref 80.0–100.0)
Platelets: 207 10*3/uL (ref 150–400)
RBC: 3.84 MIL/uL — ABNORMAL LOW (ref 4.22–5.81)
RDW: 14.8 % (ref 11.5–15.5)
WBC: 11.2 10*3/uL — ABNORMAL HIGH (ref 4.0–10.5)
nRBC: 0 % (ref 0.0–0.2)

## 2020-05-24 LAB — DIFFERENTIAL
Abs Immature Granulocytes: 0.05 10*3/uL (ref 0.00–0.07)
Basophils Absolute: 0 10*3/uL (ref 0.0–0.1)
Basophils Relative: 0 %
Eosinophils Absolute: 0 10*3/uL (ref 0.0–0.5)
Eosinophils Relative: 0 %
Immature Granulocytes: 0 %
Lymphocytes Relative: 11 %
Lymphs Abs: 1.3 10*3/uL (ref 0.7–4.0)
Monocytes Absolute: 0.4 10*3/uL (ref 0.1–1.0)
Monocytes Relative: 4 %
Neutro Abs: 9.5 10*3/uL — ABNORMAL HIGH (ref 1.7–7.7)
Neutrophils Relative %: 85 %

## 2020-05-24 LAB — COMPREHENSIVE METABOLIC PANEL
ALT: 98 U/L — ABNORMAL HIGH (ref 0–44)
AST: 77 U/L — ABNORMAL HIGH (ref 15–41)
Albumin: 3.4 g/dL — ABNORMAL LOW (ref 3.5–5.0)
Alkaline Phosphatase: 117 U/L (ref 38–126)
Anion gap: 8 (ref 5–15)
BUN: 19 mg/dL (ref 6–20)
CO2: 27 mmol/L (ref 22–32)
Calcium: 8.3 mg/dL — ABNORMAL LOW (ref 8.9–10.3)
Chloride: 96 mmol/L — ABNORMAL LOW (ref 98–111)
Creatinine, Ser: 1.33 mg/dL — ABNORMAL HIGH (ref 0.61–1.24)
GFR, Estimated: >60 mL/min (ref >60)
Glucose, Bld: 152 mg/dL — ABNORMAL HIGH (ref 70–99)
Potassium: 5.4 mmol/L — ABNORMAL HIGH (ref 3.5–5.1)
Sodium: 131 mmol/L — ABNORMAL LOW (ref 135–145)
Total Bilirubin: 1 mg/dL (ref 0.3–1.2)
Total Protein: 7.1 g/dL (ref 6.5–8.1)

## 2020-05-24 LAB — FERRITIN: Ferritin: 2519 ng/mL — ABNORMAL HIGH (ref 24–336)

## 2020-05-24 LAB — POTASSIUM: Potassium: 4.6 mmol/L (ref 3.5–5.1)

## 2020-05-24 LAB — C-REACTIVE PROTEIN: CRP: 23.9 mg/dL — ABNORMAL HIGH (ref <1.0)

## 2020-05-24 LAB — CREATININE, SERUM
Creatinine, Ser: 1.35 mg/dL — ABNORMAL HIGH (ref 0.61–1.24)
GFR, Estimated: >60 mL/min (ref >60)

## 2020-05-24 LAB — HIV ANTIBODY (ROUTINE TESTING W REFLEX): HIV Screen 4th Generation wRfx: NONREACTIVE

## 2020-05-24 MED ORDER — SODIUM ZIRCONIUM CYCLOSILICATE 10 G PO PACK
10.0000 g | PACK | Freq: Once | ORAL | Status: AC
  Administered 2020-05-24: 10 g via ORAL
  Filled 2020-05-24: qty 1

## 2020-05-24 MED ORDER — CITALOPRAM HYDROBROMIDE 20 MG PO TABS
40.0000 mg | ORAL_TABLET | Freq: Every day | ORAL | Status: DC
  Administered 2020-05-24 – 2020-05-27 (×4): 40 mg via ORAL
  Filled 2020-05-24 (×3): qty 2
  Filled 2020-05-24: qty 4
  Filled 2020-05-24: qty 2

## 2020-05-24 MED ORDER — SODIUM CHLORIDE 0.9 % IV SOLN: 100.0000 mg | Freq: Every day | INTRAVENOUS | Status: DC

## 2020-05-24 MED ORDER — ALBUTEROL SULFATE HFA 108 (90 BASE) MCG/ACT IN AERS: 2.0000 | INHALATION_SPRAY | RESPIRATORY_TRACT | Status: DC | PRN

## 2020-05-24 MED ORDER — PREDNISONE 50 MG PO TABS: 50.0000 mg | ORAL_TABLET | Freq: Every day | ORAL | Status: DC

## 2020-05-24 MED ORDER — ENOXAPARIN SODIUM 40 MG/0.4ML ~~LOC~~ SOLN
40.0000 mg | SUBCUTANEOUS | Status: DC
  Administered 2020-05-24 – 2020-05-27 (×4): 40 mg via SUBCUTANEOUS
  Filled 2020-05-24 (×4): qty 0.4

## 2020-05-24 MED ORDER — METHYLPREDNISOLONE SODIUM SUCC 125 MG IJ SOLR
60.0000 mg | Freq: Two times a day (BID) | INTRAMUSCULAR | Status: DC
  Administered 2020-05-24 – 2020-05-27 (×7): 60 mg via INTRAVENOUS
  Filled 2020-05-24 (×7): qty 2

## 2020-05-24 MED ORDER — ASCORBIC ACID 500 MG PO TABS
500.0000 mg | ORAL_TABLET | Freq: Every day | ORAL | Status: DC
  Administered 2020-05-24 – 2020-05-27 (×4): 500 mg via ORAL
  Filled 2020-05-24 (×4): qty 1

## 2020-05-24 MED ORDER — METHYLPREDNISOLONE SODIUM SUCC 125 MG IJ SOLR: 100.0000 mg | Freq: Two times a day (BID) | INTRAMUSCULAR | Status: DC

## 2020-05-24 MED ORDER — FAMOTIDINE 20 MG PO TABS
20.0000 mg | ORAL_TABLET | Freq: Every day | ORAL | Status: DC
  Administered 2020-05-24 – 2020-05-27 (×4): 20 mg via ORAL
  Filled 2020-05-24 (×4): qty 1

## 2020-05-24 MED ORDER — SODIUM CHLORIDE 0.9 % IV SOLN: 200.0000 mg | Freq: Once | INTRAVENOUS | Status: DC

## 2020-05-24 MED ORDER — HYDROCOD POLST-CPM POLST ER 10-8 MG/5ML PO SUER
5.0000 mL | Freq: Two times a day (BID) | ORAL | Status: DC | PRN
  Administered 2020-05-26: 5 mL via ORAL
  Filled 2020-05-24: qty 5

## 2020-05-24 MED ORDER — GUAIFENESIN-DM 100-10 MG/5ML PO SYRP: 10.0000 mL | ORAL_SOLUTION | ORAL | Status: DC | PRN

## 2020-05-24 MED ORDER — LACTATED RINGERS IV SOLN: INTRAVENOUS | Status: DC

## 2020-05-24 MED ORDER — MELOXICAM 15 MG PO TABS
15.0000 mg | ORAL_TABLET | Freq: Every day | ORAL | Status: DC
  Administered 2020-05-24: 15 mg via ORAL
  Filled 2020-05-24: qty 1

## 2020-05-24 MED ORDER — VALACYCLOVIR HCL 500 MG PO TABS
1000.0000 mg | ORAL_TABLET | Freq: Every day | ORAL | Status: DC
  Administered 2020-05-24 – 2020-05-27 (×4): 1000 mg via ORAL
  Filled 2020-05-24 (×4): qty 2

## 2020-05-24 MED ORDER — BARICITINIB 2 MG PO TABS
4.0000 mg | ORAL_TABLET | Freq: Every day | ORAL | Status: DC
  Administered 2020-05-24 – 2020-05-27 (×4): 4 mg via ORAL
  Filled 2020-05-24 (×4): qty 2

## 2020-05-24 NOTE — ED Notes (Signed)
Given incentive spirometer, encouraged to use q hour.

## 2020-05-24 NOTE — Progress Notes (Signed)
PROGRESS NOTE  Kevin West YFR:[id number] DOB: 07/22/1970 DOA: 05/23/2020  PCP: System, Provider Not In  Brief History/Interval Summary: 49 year old male with a past medical history of depression who is unvaccinated for COVID-19 comes in with shortness of breath abdominal pain ongoing for the last week or so. He took home COVID-19 test a week ago that was positive. He has had some diarrhea abdominal cramping. His shortness of breath continued to worsen so he decided to come to the emergency department. He was noted to be hypoxic. Chest x-ray showed pneumonia is consistent with COVID-19. He was hospitalized for further management.  Reason for Visit: Pneumonia due to COVID-19. Acute respiratory failure with hypoxia  Consultants: None  Procedures: None  Antibiotics: Anti-infectives (From admission, onward)   Start     Dose/Rate Route Frequency Ordered Stop   05/25/20 1000  remdesivir 100 mg in sodium chloride 0.9 % 100 mL IVPB  Status:  Discontinued       "Followed by" Linked Group Details   100 mg 200 mL/hr over 30 Minutes Intravenous Daily 05/24/20 0234 05/24/20 0238   05/24/20 0900  valACYclovir (VALTREX) tablet 1,000 mg        1,000 mg Oral Daily 05/24/20 0234     05/24/20 0900  remdesivir 100 mg in sodium chloride 0.9 % 100 mL IVPB       "Followed by" Linked Group Details   100 mg 200 mL/hr over 30 Minutes Intravenous Daily 05/23/20 2331 05/28/20 0859   05/23/20 2345  remdesivir 200 mg in sodium chloride 0.9% 250 mL IVPB       "Followed by" Linked Group Details   200 mg 580 mL/hr over 30 Minutes Intravenous Once 05/23/20 2331 05/24/20 0127   05/23/20 2330  remdesivir 200 mg in sodium chloride 0.9% 250 mL IVPB  Status:  Discontinued       "Followed by" Linked Group Details   200 mg 580 mL/hr over 30 Minutes Intravenous Once 05/24/20 0234 05/24/20 0238      Subjective/Interval History: Patient mentions that he is feeling slightly better today compared to yesterday though  still remains very short of breath. Denies any nausea vomiting this morning. Feels dehydrated. Denies any abdominal pain today.    Assessment/Plan:  Acute Hypoxic Resp. Failure/Pneumonia due to COVID-19   Recent Labs  Lab 05/23/20 2100 05/24/20 0306 05/24/20 0459  DDIMER 0.66*  --   --   FERRITIN 2,634*  --  2,519*  CRP 23.8*  --  23.9*  ALT 101* 98*  --   PROCALCITON 0.62  --   --     Objective findings: Fever: Noted to be afebrile this morning Oxygen requirements: Currently on nasal cannula 6 L/min. Saturating in the early 90s  COVID 19 Therapeutics: Antibacterials: None Remdesivir: Day 2 Steroids: Solu-Medrol 60 mg twice a day Diuretics: None yet Inhaled Steroids: Initiate Dulera Baricitinib: Initiated on 12/30 PUD Prophylaxis: Will initiate Pepcid DVT Prophylaxis:  Lovenox 40 mg once a day  From a respiratory standpoint patient is stable though tenuous. He is requiring 6 L of oxygen. D-dimer was only 0.66. Inflammatory markers noted to be significantly elevated. Procalcitonin was only 0.62. No concern for bacterial infection at this time. CT angiogram was done which did not show any PE. Patient noted to be on Remdesivir and Solu-Medrol. Due to concern for worsening respiratory status he was offered baricitinib. Patient denies any history of liver disease, previous history of tuberculosis or any GI issues.   Based on ACTT-2 and COV-BARRIER  trials and other available data, baricitinib is being used under EUA by the FDA. The patient has no ESRD or AKI, known history of TB, severe neutropenia (ANC <500) or lymphopenia (ALC <200), or severe LFT elevations. They are not on DMARDs or probenecid, and are not pregnant. The option to use/refuse baricitinib treatment under FDA authorization (not approval), the significant known and potential risks and benefits, the extent to which these are unknown, and information regarding all available alternatives were discussed in detail.  Specifically the risk of VTE and secondary infections were discussed in detail with the patient and/or HCPOA. They consent to proceed with treatment. Patient was started on same on 12/30.  Continue to trend inflammatory markers and D-dimer. Patient encouraged to stay in prone position as much as possible.  Incentive spirometer.  Mobilization.  Inflammatory markers and d-dimer as above.  The treatment plan and use of medications and known side effects were discussed with patient/family. Some of the medications used are based on case reports/anecdotal data.  All other medications being used in the management of COVID-19 based on limited study data.  Complete risks and long-term side effects are unknown, however in the best clinical judgment they seem to be of some benefit.  Patient/family wanted to proceed with treatment options provided.  Mild transaminitis Most likely secondary to COVID-19. Continue to trend LFTs.  Elevated blood pressure No documented history of hypertension. Does not take any antihypertensives at home. Could be due to his anxiety. Continue to monitor for now.  Dehydration with hyponatremia/hyperkalemia Creatinine noted to be 1.33. This is similar to what it was back in 2018. Hydrated. Continue IV fluids. We will give him a dose of Lokelma. Recheck potassium later today.  Depression Continue citalopram  Obesity Estimated body mass index is 33.19 kg/m as calculated from the following:   Height as of 12/03/16: 6' (1.829 m).   Weight as of this encounter: 111 kg.   DVT Prophylaxis: Lovenox Code Status: Full code Family Communication: We will update his wife later today Disposition Plan: Hopefully return home when improved  Status is: Inpatient  Remains inpatient appropriate because:IV treatments appropriate due to intensity of illness or inability to take PO and Inpatient level of care appropriate due to severity of illness   Dispo: The patient is from: Home               Anticipated d/c is to: Home              Anticipated d/c date is: 3 days              Patient currently is not medically stable to d/c.      Medications:  Scheduled: . baricitinib  4 mg Oral Daily  . citalopram  40 mg Oral Daily  . enoxaparin (LOVENOX) injection  40 mg Subcutaneous Q24H  . meloxicam  15 mg Oral Daily  . methylPREDNISolone (SOLU-MEDROL) injection  60 mg Intravenous Q12H  . valACYclovir  1,000 mg Oral Daily   Continuous: . lactated ringers 100 mL/hr at 05/24/20 0305  . remdesivir 100 mg in NS 100 mL Stopped (05/24/20 1031)   IBB:CWUGQBVQX, chlorpheniramine-HYDROcodone, guaiFENesin-dextromethorphan   Objective:  Vital Signs  Vitals:   05/24/20 0800 05/24/20 0900 05/24/20 0930 05/24/20 1000  BP: (!) 154/89 (!) 145/92 (!) 155/99 (!) 156/99  Pulse: 75 84 78 75  Resp: (!) 25 (!) 26  (!) 24  Temp:      TempSrc:      SpO2: 96% 93%  91% 92%  Weight:        Intake/Output Summary (Last 24 hours) at 05/24/2020 1114 Last data filed at 05/24/2020 1031 Gross per 24 hour  Intake 100 ml  Output --  Net 100 ml   Filed Weights   05/24/20 0200  Weight: 111 kg    General appearance: Awake alert.  In no distress Resp: Clear to auscultation bilaterally.  Normal effort Cardio: S1-S2 is normal regular.  No S3-S4.  No rubs murmurs or bruit GI: Abdomen is soft.  Nontender nondistended.  Bowel sounds are present normal.  No masses organomegaly Extremities: No edema.  Full range of motion of lower extremities. Neurologic: Alert and oriented x3.  No focal neurological deficits.    Lab Results:  Data Reviewed: I have personally reviewed following labs and imaging studies  CBC: Recent Labs  Lab 05/23/20 2100 05/24/20 0306  WBC 11.0* 11.2*  NEUTROABS 9.0* 9.5*  HGB 12.6* 12.2*  HCT 35.5* 34.9*  MCV 90.3 90.9  PLT 208 207    Basic Metabolic Panel: Recent Labs  Lab 05/23/20 2100 05/24/20 0306  NA 131* 131*  K 4.3 5.4*  CL 95* 96*  CO2 27 27   GLUCOSE 142* 152*  BUN 16 19  CREATININE 1.30* 1.33*  1.35*  CALCIUM 8.4* 8.3*    GFR: CrCl cannot be calculated (Unknown ideal weight.).  Liver Function Tests: Recent Labs  Lab 05/23/20 2100 05/24/20 0306  AST 84* 77*  ALT 101* 98*  ALKPHOS 113 117  BILITOT 1.5* 1.0  PROT 7.5 7.1  ALBUMIN 3.7 3.4*    No results for input(s): LIPASE, AMYLASE in the last 168 hours. No results for input(s): AMMONIA in the last 168 hours.  Coagulation Profile: No results for input(s): INR, PROTIME in the last 168 hours.  Cardiac Enzymes: No results for input(s): CKTOTAL, CKMB, CKMBINDEX, TROPONINI in the last 168 hours.  BNP (last 3 results) No results for input(s): PROBNP in the last 8760 hours.  HbA1C: No results for input(s): HGBA1C in the last 72 hours.  CBG: No results for input(s): GLUCAP in the last 168 hours.  Lipid Profile: Recent Labs    05/23/20 2100  TRIG 183*    Thyroid Function Tests: No results for input(s): TSH, T4TOTAL, FREET4, T3FREE, THYROIDAB in the last 72 hours.  Anemia Panel: Recent Labs    05/23/20 2100 05/24/20 0459  FERRITIN 2,634* 2,519*    Recent Results (from the past 240 hour(s))  Resp Panel by RT-PCR (Flu A&B, Covid) Nasopharyngeal Swab     Status: Abnormal   Collection Time: 05/23/20  9:44 PM   Specimen: Nasopharyngeal Swab; Nasopharyngeal(NP) swabs in vial transport medium  Result Value Ref Range Status   SARS Coronavirus 2 by RT PCR POSITIVE (A) NEGATIVE Final    Comment: RESULT CALLED TO, READ BACK BY AND VERIFIED WITH: J. NASH 12.29.21 @ 2305 BY MECIAL J. (NOTE) SARS-CoV-2 target nucleic acids are DETECTED.  The SARS-CoV-2 RNA is generally detectable in upper respiratory specimens during the acute phase of infection. Positive results are indicative of the presence of the identified virus, but do not rule out bacterial infection or co-infection with other pathogens not detected by the test. Clinical correlation with patient  history and other diagnostic information is necessary to determine patient infection status. The expected result is Negative.  Fact Sheet for Patients: BloggerCourse.comhttps://www.fda.gov/media/152166/download  Fact Sheet for Healthcare Providers: SeriousBroker.ithttps://www.fda.gov/media/152162/download  This test is not yet approved or cleared by the [place] FDA and  has  been authorized for detection and/or diagnosis of SARS-CoV-2 by FDA under an Emergency Use Authorization (EUA).  This EUA will remain in effect (meaning this test can  be used) for the duration of  the COVID-19 declaration under Section 564(b)(1) of the Act, 21 U.S.C. section 360bbb-3(b)(1), unless the authorization is terminated or revoked sooner.     Influenza A by PCR NEGATIVE NEGATIVE Final   Influenza B by PCR NEGATIVE NEGATIVE Final    Comment: (NOTE) The Xpert Xpress SARS-CoV-2/FLU/RSV plus assay is intended as an aid in the diagnosis of influenza from Nasopharyngeal swab specimens and should not be used as a sole basis for treatment. Nasal washings and aspirates are unacceptable for Xpert Xpress SARS-CoV-2/FLU/RSV testing.  Fact Sheet for Patients: BloggerCourse.com  Fact Sheet for Healthcare Providers: SeriousBroker.it  This test is not yet approved or cleared by the [place] FDA and has been authorized for detection and/or diagnosis of SARS-CoV-2 by FDA under an Emergency Use Authorization (EUA). This EUA will remain in effect (meaning this test can be used) for the duration of the COVID-19 declaration under Section 564(b)(1) of the Act, 21 U.S.C. section 360bbb-3(b)(1), unless the authorization is terminated or revoked.  Performed at [facility], [address], [place], [place] [address]       Radiology Studies: CT Angio Chest PE W and/or Wo Contrast  Result Date: 05/24/2020 CLINICAL DATA:  Chest pain and shortness of breath. Suspected  COVID-19. EXAM: CT ANGIOGRAPHY CHEST WITH CONTRAST TECHNIQUE: Multidetector CT imaging of the chest was performed using the standard protocol during bolus administration of intravenous contrast. Multiplanar CT image reconstructions and MIPs were obtained to evaluate the vascular anatomy. CONTRAST:  OMNIPAQUE IOHEXOL 350 MG/ML SOLN COMPARISON:  None. FINDINGS: Cardiovascular: Contrast injection is sufficient to demonstrate satisfactory opacification of the pulmonary arteries to the segmental level. There is no pulmonary embolus or evidence of right heart strain. The size of the main pulmonary artery is normal. Heart size is normal, with no pericardial effusion. The course and caliber of the aorta are normal. There is no atherosclerotic calcification. Opacification decreased due to pulmonary arterial phase contrast bolus timing. Mediastinum/Nodes: No mediastinal, hilar or axillary lymphadenopathy. Normal visualized thyroid. Thoracic esophageal course is normal. Lungs/Pleura: Multifocal, peripheral predominant ground glass opacities. No pleural effusion. Upper Abdomen: Contrast bolus timing is not optimized for evaluation of the abdominal organs. The visualized portions of the organs of the upper abdomen are normal. Musculoskeletal: No chest wall abnormality. No bony spinal canal stenosis. Review of the MIP images confirms the above findings. IMPRESSION: 1. No pulmonary embolus or acute aortic syndrome. 2. COVID-19 pattern pneumoniae. Electronically Signed   By: Deatra Robinson M.D.   On: 05/24/2020 01:39   DG Chest Port 1 View  Result Date: 05/23/2020 CLINICAL DATA:  COVID pneumonia short of breath EXAM: PORTABLE CHEST 1 VIEW COMPARISON:  02/01/2011 FINDINGS: Diffuse bilateral streaky and ground-glass opacity left greater than right. No pleural effusion. Normal cardiomediastinal silhouette. No pneumothorax IMPRESSION: Diffuse left greater than right bilateral streaky and ground-glass opacity, consistent with  pneumonia Electronically Signed   By: Jasmine Pang M.D.   On: 05/23/2020 20:52       LOS: 1 day   Osvaldo Shipper  [facility] Pager on [email or web address]  05/24/2020, 11:14 AM

## 2020-05-24 NOTE — ED Notes (Signed)
Patient requested a bedside commode

## 2020-05-24 NOTE — ED Notes (Addendum)
Portable bedside fan delivered to patient

## 2020-05-24 NOTE — ED Notes (Signed)
Fresh Pitcher of ice water

## 2020-05-24 NOTE — ED Notes (Signed)
Lunch tray delivered.

## 2020-05-25 LAB — CBC WITH DIFFERENTIAL/PLATELET
Abs Immature Granulocytes: 0.05 10*3/uL (ref 0.00–0.07)
Basophils Absolute: 0 10*3/uL (ref 0.0–0.1)
Basophils Relative: 0 %
Eosinophils Absolute: 0 10*3/uL (ref 0.0–0.5)
Eosinophils Relative: 0 %
HCT: 32.3 % — ABNORMAL LOW (ref 39.0–52.0)
Hemoglobin: 11.1 g/dL — ABNORMAL LOW (ref 13.0–17.0)
Immature Granulocytes: 1 %
Lymphocytes Relative: 18 %
Lymphs Abs: 1.8 10*3/uL (ref 0.7–4.0)
MCH: 32.3 pg (ref 26.0–34.0)
MCHC: 34.4 g/dL (ref 30.0–36.0)
MCV: 93.9 fL (ref 80.0–100.0)
Monocytes Absolute: 0.4 10*3/uL (ref 0.1–1.0)
Monocytes Relative: 5 %
Neutro Abs: 7.6 10*3/uL (ref 1.7–7.7)
Neutrophils Relative %: 76 %
Platelets: 263 10*3/uL (ref 150–400)
RBC: 3.44 MIL/uL — ABNORMAL LOW (ref 4.22–5.81)
RDW: 14.6 % (ref 11.5–15.5)
WBC: 9.8 10*3/uL (ref 4.0–10.5)
nRBC: 0.2 % (ref 0.0–0.2)

## 2020-05-25 LAB — HEPATITIS PANEL, ACUTE
HCV Ab: NONREACTIVE
Hep A IgM: NONREACTIVE
Hep B C IgM: NONREACTIVE
Hepatitis B Surface Ag: NONREACTIVE

## 2020-05-25 LAB — C-REACTIVE PROTEIN: CRP: 14.6 mg/dL — ABNORMAL HIGH (ref <1.0)

## 2020-05-25 LAB — COMPREHENSIVE METABOLIC PANEL
ALT: 84 U/L — ABNORMAL HIGH (ref 0–44)
AST: 68 U/L — ABNORMAL HIGH (ref 15–41)
Albumin: 3.3 g/dL — ABNORMAL LOW (ref 3.5–5.0)
Alkaline Phosphatase: 112 U/L (ref 38–126)
Anion gap: 9 (ref 5–15)
BUN: 28 mg/dL — ABNORMAL HIGH (ref 6–20)
CO2: 28 mmol/L (ref 22–32)
Calcium: 8.8 mg/dL — ABNORMAL LOW (ref 8.9–10.3)
Chloride: 97 mmol/L — ABNORMAL LOW (ref 98–111)
Creatinine, Ser: 1.1 mg/dL (ref 0.61–1.24)
GFR, Estimated: >60 mL/min (ref >60)
Glucose, Bld: 149 mg/dL — ABNORMAL HIGH (ref 70–99)
Potassium: 4.9 mmol/L (ref 3.5–5.1)
Sodium: 134 mmol/L — ABNORMAL LOW (ref 135–145)
Total Bilirubin: 0.7 mg/dL (ref 0.3–1.2)
Total Protein: 6.7 g/dL (ref 6.5–8.1)

## 2020-05-25 LAB — D-DIMER, QUANTITATIVE: D-Dimer, Quant: 0.52 ug/mL-FEU — ABNORMAL HIGH (ref 0.00–0.50)

## 2020-05-25 NOTE — Plan of Care (Signed)
  Problem: Coping: Goal: Level of anxiety will decrease Outcome: Progressing   Problem: Pain Managment: Goal: General experience of comfort will improve Outcome: Progressing   Problem: Safety: Goal: Ability to remain free from injury will improve Outcome: Progressing   Problem: Skin Integrity: Goal: Risk for impaired skin integrity will decrease Outcome: Progressing   

## 2020-05-25 NOTE — Plan of Care (Signed)

## 2020-05-25 NOTE — Progress Notes (Signed)
PROGRESS NOTE  Shelly Shoultz UXN:[id number] DOB: 1970/09/07 DOA: 05/23/2020  PCP: System, Provider Not In  Brief History/Interval Summary: 49 year old male with a past medical history of depression who is unvaccinated for COVID-19 comes in with shortness of breath abdominal pain ongoing for the last week or so. He took home COVID-19 test a week ago that was positive. He has had some diarrhea abdominal cramping. His shortness of breath continued to worsen so he decided to come to the emergency department. He was noted to be hypoxic. Chest x-ray showed pneumonia is consistent with COVID-19. He was hospitalized for further management.  Reason for Visit: Pneumonia due to COVID-19. Acute respiratory failure with hypoxia  Consultants: None  Procedures: None  Antibiotics: Anti-infectives (From admission, onward)   Start     Dose/Rate Route Frequency Ordered Stop   05/25/20 1000  remdesivir 100 mg in sodium chloride 0.9 % 100 mL IVPB  Status:  Discontinued       "Followed by" Linked Group Details   100 mg 200 mL/hr over 30 Minutes Intravenous Daily 05/24/20 0234 05/24/20 0238   05/24/20 0900  valACYclovir (VALTREX) tablet 1,000 mg        1,000 mg Oral Daily 05/24/20 0234     05/24/20 0900  remdesivir 100 mg in sodium chloride 0.9 % 100 mL IVPB       "Followed by" Linked Group Details   100 mg 200 mL/hr over 30 Minutes Intravenous Daily 05/23/20 2331 05/28/20 0859   05/23/20 2345  remdesivir 200 mg in sodium chloride 0.9% 250 mL IVPB       "Followed by" Linked Group Details   200 mg 580 mL/hr over 30 Minutes Intravenous Once 05/23/20 2331 05/24/20 0127   05/23/20 2330  remdesivir 200 mg in sodium chloride 0.9% 250 mL IVPB  Status:  Discontinued       "Followed by" Linked Group Details   200 mg 580 mL/hr over 30 Minutes Intravenous Once 05/24/20 0234 05/24/20 0238      Subjective/Interval History: Still significant DOE, but reports he starts to move more Denies pain, no  fever.    Assessment/Plan:  Acute Hypoxic Resp. Failure/Pneumonia due to COVID-19   Recent Labs  Lab 05/23/20 2100 05/24/20 0306 05/24/20 0459 05/25/20 0347  DDIMER 0.66*  --   --  0.52*  FERRITIN 2,634*  --  2,519*  --   CRP 23.8*  --  23.9* 14.6*  ALT 101* 98*  --  84*  PROCALCITON 0.62  --   --   --     Objective findings: Fever: Noted to be afebrile this morning Oxygen requirements: Currently on nasal cannula 6 L/min. Saturating in the early 90s  COVID 19 Therapeutics: Antibacterials: None Remdesivir: Day 2 Steroids: Solu-Medrol 60 mg twice a day Diuretics: None yet Inhaled Steroids: Initiate Dulera Baricitinib: Initiated on 12/30 PUD Prophylaxis: Will initiate Pepcid DVT Prophylaxis:  Lovenox 40 mg once a day  From a respiratory standpoint patient is stable though tenuous. He is requiring 6 L of oxygen. D-dimer was only 0.66. Inflammatory markers noted to be significantly elevated. Procalcitonin was only 0.62. No concern for bacterial infection at this time. CT angiogram was done which did not show any PE. Patient noted to be on Remdesivir and Solu-Medrol. Due to concern for worsening respiratory status he was offered baricitinib. Patient denies any history of liver disease, previous history of tuberculosis or any GI issues.   Based on ACTT-2 and COV-BARRIER trials and other available data, baricitinib is  being used under EUA by the FDA. The patient has no ESRD or AKI, known history of TB, severe neutropenia (ANC <500) or lymphopenia (ALC <200), or severe LFT elevations. They are not on DMARDs or probenecid, and are not pregnant. The option to use/refuse baricitinib treatment under FDA authorization (not approval), the significant known and potential risks and benefits, the extent to which these are unknown, and information regarding all available alternatives were discussed in detail. Specifically the risk of VTE and secondary infections were discussed in detail with the  patient and/or HCPOA. They consent to proceed with treatment. Patient was started on same on 12/30.  Continue to trend inflammatory markers and D-dimer. Patient encouraged to stay in prone position as much as possible.  Incentive spirometer.  Mobilization.  Inflammatory markers and d-dimer as above.  The treatment plan and use of medications and known side effects were discussed with patient/family. Some of the medications used are based on case reports/anecdotal data.  All other medications being used in the management of COVID-19 based on limited study data.  Complete risks and long-term side effects are unknown, however in the best clinical judgment they seem to be of some benefit.  Patient/family wanted to proceed with treatment options provided.  Mild transaminitis Most likely secondary to COVID-19. Continue to trend LFTs.  Elevated blood pressure No documented history of hypertension. Does not take any antihypertensives at home. Could be due to his anxiety. Continue to monitor for now.  Dehydration with hyponatremia/hyperkalemia Creatinine noted to be 1.33. This is similar to what it was back in 2018. Hydrated. Continue IV fluids. We will give him a dose of Lokelma. Recheck potassium later today.  Depression Continue citalopram  Obesity Estimated body mass index is 32.1 kg/m as calculated from the following:   Height as of this encounter: 6\' 2"  (1.88 m).   Weight as of this encounter: 113.4 kg.   DVT Prophylaxis: Lovenox Code Status: Full code Family Communication:  wife over the phone Disposition Plan: Hopefully return home when improved  Status is: Inpatient  Remains inpatient appropriate because:IV treatments appropriate due to intensity of illness or inability to take PO and Inpatient level of care appropriate due to severity of illness   Dispo: The patient is from: Home              Anticipated d/c is to: Home              Anticipated d/c date is: 3 days               Patient currently is not medically stable to d/c.      Medications:  Scheduled: . vitamin C  500 mg Oral Daily  . baricitinib  4 mg Oral Daily  . citalopram  40 mg Oral Daily  . enoxaparin (LOVENOX) injection  40 mg Subcutaneous Q24H  . famotidine  20 mg Oral Daily  . methylPREDNISolone (SOLU-MEDROL) injection  60 mg Intravenous Q12H  . valACYclovir  1,000 mg Oral Daily   Continuous: . remdesivir 100 mg in NS 100 mL 100 mg (05/25/20 0955)   ZOX:[id number]:albuterol, chlorpheniramine-HYDROcodone, guaiFENesin-dextromethorphan   Objective:  Vital Signs  Vitals:   05/24/20 2122 05/25/20 0059 05/25/20 0517 05/25/20 1523  BP: (!) 143/93 139/84 130/89 (!) 141/69  Pulse: 73 61 67 79  Resp: 18 18 18  (!) 24  Temp: 98.2 F (36.8 C) 97.8 F (36.6 C) 98.2 F (36.8 C) 98.3 F (36.8 C)  TempSrc:  Oral Oral Oral  SpO2:  95% 91% 91% 99%  Weight:      Height:        Intake/Output Summary (Last 24 hours) at 05/25/2020 1848 Last data filed at 05/25/2020 1519 Gross per 24 hour  Intake 3060.92 ml  Output 2175 ml  Net 885.92 ml   Filed Weights   05/24/20 0200 05/24/20 1728  Weight: 111 kg 113.4 kg    General appearance: Awake alert.  In no distress Resp: Clear to auscultation bilaterally.  Normal effort Cardio: S1-S2 is normal regular.  No S3-S4.  No rubs murmurs or bruit GI: Abdomen is soft.  Nontender nondistended.  Bowel sounds are present normal.  No masses organomegaly Extremities: No edema.  Full range of motion of lower extremities. Neurologic: Alert and oriented x3.  No focal neurological deficits.    Lab Results:  Data Reviewed: I have personally reviewed following labs and imaging studies  CBC: Recent Labs  Lab 05/23/20 2100 05/24/20 0306 05/25/20 0347  WBC 11.0* 11.2* 9.8  NEUTROABS 9.0* 9.5* 7.6  HGB 12.6* 12.2* 11.1*  HCT 35.5* 34.9* 32.3*  MCV 90.3 90.9 93.9  PLT 208 207 263    Basic Metabolic Panel: Recent Labs  Lab 05/23/20 2100 05/24/20 0306  05/24/20 1142 05/25/20 0347  NA 131* 131*  --  134*  K 4.3 5.4* 4.6 4.9  CL 95* 96*  --  97*  CO2 27 27  --  28  GLUCOSE 142* 152*  --  149*  BUN 16 19  --  28*  CREATININE 1.30* 1.33*  1.35*  --  1.10  CALCIUM 8.4* 8.3*  --  8.8*    GFR: Estimated Creatinine Clearance: 107.6 mL/min (by C-G formula based on SCr of 1.1 mg/dL).  Liver Function Tests: Recent Labs  Lab 05/23/20 2100 05/24/20 0306 05/25/20 0347  AST 84* 77* 68*  ALT 101* 98* 84*  ALKPHOS 113 117 112  BILITOT 1.5* 1.0 0.7  PROT 7.5 7.1 6.7  ALBUMIN 3.7 3.4* 3.3*    No results for input(s): LIPASE, AMYLASE in the last 168 hours. No results for input(s): AMMONIA in the last 168 hours.  Coagulation Profile: No results for input(s): INR, PROTIME in the last 168 hours.  Cardiac Enzymes: No results for input(s): CKTOTAL, CKMB, CKMBINDEX, TROPONINI in the last 168 hours.  BNP (last 3 results) No results for input(s): PROBNP in the last 8760 hours.  HbA1C: No results for input(s): HGBA1C in the last 72 hours.  CBG: No results for input(s): GLUCAP in the last 168 hours.  Lipid Profile: Recent Labs    05/23/20 2100  TRIG 183*    Thyroid Function Tests: No results for input(s): TSH, T4TOTAL, FREET4, T3FREE, THYROIDAB in the last 72 hours.  Anemia Panel: Recent Labs    05/23/20 2100 05/24/20 0459  FERRITIN 2,634* 2,519*    Recent Results (from the past 240 hour(s))  Blood Culture (routine x 2)     Status: None (Preliminary result)   Collection Time: 05/23/20  9:00 PM   Specimen: BLOOD  Result Value Ref Range Status   Specimen Description   Final    BLOOD RIGHT ANTECUBITAL Performed at [facility], [address], [place], [place] [address]    Special Requests   Final    BOTTLES DRAWN AEROBIC AND ANAEROBIC Blood Culture adequate volume Performed at [facility], [address] [place], [place] [address]    Culture   Final    NO GROWTH 1  DAY Performed at  [facility], [address], [place], [place] [address]    Report Status PENDING  Incomplete  Blood Culture (routine x 2)     Status: None (Preliminary result)   Collection Time: 05/23/20  9:10 PM   Specimen: BLOOD  Result Value Ref Range Status   Specimen Description   Final    BLOOD LEFT ARM Performed at [facility], [address], [place], [place] [address]    Special Requests   Final    BOTTLES DRAWN AEROBIC AND ANAEROBIC Blood Culture adequate volume Performed at [facility], [address], [place], [place] [address]    Culture   Final    NO GROWTH 1 DAY Performed at [facility], [address] [place], [place] [address]    Report Status PENDING  Incomplete  Resp Panel by RT-PCR (Flu A&B, Covid) Nasopharyngeal Swab     Status: Abnormal   Collection Time: 05/23/20  9:44 PM   Specimen: Nasopharyngeal Swab; Nasopharyngeal(NP) swabs in vial transport medium  Result Value Ref Range Status   SARS Coronavirus 2 by RT PCR POSITIVE (A) NEGATIVE Final    Comment: RESULT CALLED TO, READ BACK BY AND VERIFIED WITH: J. NASH 12.29.21 @ 2305 BY MECIAL J. (NOTE) SARS-CoV-2 target nucleic acids are DETECTED.  The SARS-CoV-2 RNA is generally detectable in upper respiratory specimens during the acute phase of infection. Positive results are indicative of the presence of the identified virus, but do not rule out bacterial infection or co-infection with other pathogens not detected by the test. Clinical correlation with patient history and other diagnostic information is necessary to determine patient infection status. The expected result is Negative.  Fact Sheet for Patients: BloggerCourse.com  Fact Sheet for Healthcare Providers: SeriousBroker.it  This test is not yet approved or cleared by the [place] FDA and  has been authorized for detection and/or  diagnosis of SARS-CoV-2 by FDA under an Emergency Use Authorization (EUA).  This EUA will remain in effect (meaning this test can  be used) for the duration of  the COVID-19 declaration under Section 564(b)(1) of the Act, 21 U.S.C. section 360bbb-3(b)(1), unless the authorization is terminated or revoked sooner.     Influenza A by PCR NEGATIVE NEGATIVE Final   Influenza B by PCR NEGATIVE NEGATIVE Final    Comment: (NOTE) The Xpert Xpress SARS-CoV-2/FLU/RSV plus assay is intended as an aid in the diagnosis of influenza from Nasopharyngeal swab specimens and should not be used as a sole basis for treatment. Nasal washings and aspirates are unacceptable for Xpert Xpress SARS-CoV-2/FLU/RSV testing.  Fact Sheet for Patients: BloggerCourse.com  Fact Sheet for Healthcare Providers: SeriousBroker.it  This test is not yet approved or cleared by the [place] FDA and has been authorized for detection and/or diagnosis of SARS-CoV-2 by FDA under an Emergency Use Authorization (EUA). This EUA will remain in effect (meaning this test can be used) for the duration of the COVID-19 declaration under Section 564(b)(1) of the Act, 21 U.S.C. section 360bbb-3(b)(1), unless the authorization is terminated or revoked.  Performed at [facility], [address] [place], [place] [address]       Radiology Studies: CT Angio Chest PE W and/or Wo Contrast  Result Date: 05/24/2020 CLINICAL DATA:  Chest pain and shortness of breath. Suspected COVID-19. EXAM: CT ANGIOGRAPHY CHEST WITH CONTRAST TECHNIQUE: Multidetector CT imaging of the chest was performed using the standard protocol during bolus administration of intravenous contrast. Multiplanar CT image  reconstructions and MIPs were obtained to evaluate the vascular anatomy. CONTRAST:  OMNIPAQUE IOHEXOL 350 MG/ML SOLN COMPARISON:  None. FINDINGS: Cardiovascular: Contrast  injection is sufficient to demonstrate satisfactory opacification of the pulmonary arteries to the segmental level. There is no pulmonary embolus or evidence of right heart strain. The size of the main pulmonary artery is normal. Heart size is normal, with no pericardial effusion. The course and caliber of the aorta are normal. There is no atherosclerotic calcification. Opacification decreased due to pulmonary arterial phase contrast bolus timing. Mediastinum/Nodes: No mediastinal, hilar or axillary lymphadenopathy. Normal visualized thyroid. Thoracic esophageal course is normal. Lungs/Pleura: Multifocal, peripheral predominant ground glass opacities. No pleural effusion. Upper Abdomen: Contrast bolus timing is not optimized for evaluation of the abdominal organs. The visualized portions of the organs of the upper abdomen are normal. Musculoskeletal: No chest wall abnormality. No bony spinal canal stenosis. Review of the MIP images confirms the above findings. IMPRESSION: 1. No pulmonary embolus or acute aortic syndrome. 2. COVID-19 pattern pneumoniae. Electronically Signed   By: Deatra Robinson M.D.   On: 05/24/2020 01:39   DG Chest Port 1 View  Result Date: 05/23/2020 CLINICAL DATA:  COVID pneumonia short of breath EXAM: PORTABLE CHEST 1 VIEW COMPARISON:  02/01/2011 FINDINGS: Diffuse bilateral streaky and ground-glass opacity left greater than right. No pleural effusion. Normal cardiomediastinal silhouette. No pneumothorax IMPRESSION: Diffuse left greater than right bilateral streaky and ground-glass opacity, consistent with pneumonia Electronically Signed   By: Jasmine Pang M.D.   On: 05/23/2020 20:52       LOS: 2 days   Albertine Grates  [facility] Pager on [email or web address]  05/25/2020, 6:48 PM

## 2020-05-26 LAB — CBC WITH DIFFERENTIAL/PLATELET
Abs Immature Granulocytes: 0.1 10*3/uL — ABNORMAL HIGH (ref 0.00–0.07)
Basophils Absolute: 0 10*3/uL (ref 0.0–0.1)
Basophils Relative: 0 %
Eosinophils Absolute: 0 10*3/uL (ref 0.0–0.5)
Eosinophils Relative: 0 %
HCT: 32.3 % — ABNORMAL LOW (ref 39.0–52.0)
Hemoglobin: 11.1 g/dL — ABNORMAL LOW (ref 13.0–17.0)
Immature Granulocytes: 1 %
Lymphocytes Relative: 15 %
Lymphs Abs: 1.9 10*3/uL (ref 0.7–4.0)
MCH: 31.8 pg (ref 26.0–34.0)
MCHC: 34.4 g/dL (ref 30.0–36.0)
MCV: 92.6 fL (ref 80.0–100.0)
Monocytes Absolute: 0.6 10*3/uL (ref 0.1–1.0)
Monocytes Relative: 5 %
Neutro Abs: 10 10*3/uL — ABNORMAL HIGH (ref 1.7–7.7)
Neutrophils Relative %: 79 %
Platelets: 322 10*3/uL (ref 150–400)
RBC: 3.49 MIL/uL — ABNORMAL LOW (ref 4.22–5.81)
RDW: 14.5 % (ref 11.5–15.5)
WBC: 12.7 10*3/uL — ABNORMAL HIGH (ref 4.0–10.5)
nRBC: 0.6 % — ABNORMAL HIGH (ref 0.0–0.2)

## 2020-05-26 LAB — COMPREHENSIVE METABOLIC PANEL
ALT: 82 U/L — ABNORMAL HIGH (ref 0–44)
AST: 59 U/L — ABNORMAL HIGH (ref 15–41)
Albumin: 3.2 g/dL — ABNORMAL LOW (ref 3.5–5.0)
Alkaline Phosphatase: 105 U/L (ref 38–126)
Anion gap: 11 (ref 5–15)
BUN: 27 mg/dL — ABNORMAL HIGH (ref 6–20)
CO2: 27 mmol/L (ref 22–32)
Calcium: 8.7 mg/dL — ABNORMAL LOW (ref 8.9–10.3)
Chloride: 97 mmol/L — ABNORMAL LOW (ref 98–111)
Creatinine, Ser: 1.02 mg/dL (ref 0.61–1.24)
GFR, Estimated: >60 mL/min (ref >60)
Glucose, Bld: 169 mg/dL — ABNORMAL HIGH (ref 70–99)
Potassium: 4.3 mmol/L (ref 3.5–5.1)
Sodium: 135 mmol/L (ref 135–145)
Total Bilirubin: 0.7 mg/dL (ref 0.3–1.2)
Total Protein: 6.5 g/dL (ref 6.5–8.1)

## 2020-05-26 LAB — C-REACTIVE PROTEIN: CRP: 5.8 mg/dL — ABNORMAL HIGH (ref <1.0)

## 2020-05-26 LAB — D-DIMER, QUANTITATIVE: D-Dimer, Quant: 0.27 ug/mL-FEU (ref 0.00–0.50)

## 2020-05-26 MED ORDER — FUROSEMIDE 10 MG/ML IJ SOLN
40.0000 mg | Freq: Once | INTRAMUSCULAR | Status: AC
  Administered 2020-05-26: 40 mg via INTRAVENOUS
  Filled 2020-05-26: qty 4

## 2020-05-26 NOTE — Progress Notes (Signed)
PROGRESS NOTE  Kevin West XNA:[id number] DOB: 1970-08-16 DOA: 05/23/2020  PCP: System, Provider Not In  Brief History/Interval Summary: 50 year old male with a past medical history of depression who is unvaccinated for COVID-19 comes in with shortness of breath abdominal pain ongoing for the last week or so. He took home COVID-19 test a week ago that was positive. He has had some diarrhea abdominal cramping. His shortness of breath continued to worsen so he decided to come to the emergency department. He was noted to be hypoxic. Chest x-ray showed pneumonia is consistent with COVID-19. He was hospitalized for further management.  Reason for Visit: Pneumonia due to COVID-19. Acute respiratory failure with hypoxia  Consultants: None  Procedures: None  Antibiotics: Anti-infectives (From admission, onward)   Start     Dose/Rate Route Frequency Ordered Stop   05/25/20 1000  remdesivir 100 mg in sodium chloride 0.9 % 100 mL IVPB  Status:  Discontinued       "Followed by" Linked Group Details   100 mg 200 mL/hr over 30 Minutes Intravenous Daily 05/24/20 0234 05/24/20 0238   05/24/20 0900  valACYclovir (VALTREX) tablet 1,000 mg        1,000 mg Oral Daily 05/24/20 0234     05/24/20 0900  remdesivir 100 mg in sodium chloride 0.9 % 100 mL IVPB       "Followed by" Linked Group Details   100 mg 200 mL/hr over 30 Minutes Intravenous Daily 05/23/20 2331 05/28/20 0859   05/23/20 2345  remdesivir 200 mg in sodium chloride 0.9% 250 mL IVPB       "Followed by" Linked Group Details   200 mg 580 mL/hr over 30 Minutes Intravenous Once 05/23/20 2331 05/24/20 0127   05/23/20 2330  remdesivir 200 mg in sodium chloride 0.9% 250 mL IVPB  Status:  Discontinued       "Followed by" Linked Group Details   200 mg 580 mL/hr over 30 Minutes Intravenous Once 05/24/20 0234 05/24/20 0238      Subjective/Interval History:  he is improving, less cough, less dyspnea, still significant dyspnea on exertion , he is  on 5 L oxygen supplement at rest Denies pain, no fever.    Assessment/Plan:  Acute Hypoxic Resp. Failure/Pneumonia due to COVID-19   Recent Labs  Lab 05/23/20 2100 05/24/20 0306 05/24/20 0459 05/25/20 0347 05/26/20 0349  DDIMER 0.66*  --   --  0.52* 0.27  FERRITIN 2,634*  --  2,519*  --   --   CRP 23.8*  --  23.9* 14.6* 5.8*  ALT 101* 98*  --  84* 82*  PROCALCITON 0.62  --   --   --   --     Objective findings: Fever: Noted to be afebrile this morning Oxygen requirements: Currently on nasal cannula 5 L/min. Saturating around 94% at rest, sats drops with activity   COVID-19 positive test (U07.1, COVID-19) with Acute Pneumonia (J12.89, Other viral pneumonia) Acute hypoxic respiratory failure  COVID 19 Therapeutics: Antibacterials: None Remdesivir: Day 4 Steroids: Solu-Medrol 60 mg twice a day Diuretics: lasix x1 on 1/1 Inhaled Steroids: Initiate Dulera Baricitinib: Initiated on 12/30 PUD Prophylaxis: Pepcid DVT Prophylaxis:  Lovenox 40 mg once a day  He is improving from Covid pneumonia standpoint, inflammatory markers are trending down, procalcitonin 0.62, less likely superimposed bacterial infection CTA did not show PE Is improving on remdesivir,  Solu-Medrol and baricitinib, inflammatory marker trending down, still significant dyspnea on exertion, appears slightly volume overloaded, will give 1 dose of Lasix  today, wean oxygen as able, keep O2 sat above 92%    Based on ACTT-2 and COV-BARRIER trials and other available data, baricitinib is being used under EUA by the FDA. The patient has no ESRD or AKI, known history of TB, severe neutropenia (ANC <500) or lymphopenia (ALC <200), or severe LFT elevations. They are not on DMARDs or probenecid, and are not pregnant. The option to use/refuse baricitinib treatment under FDA authorization (not approval), the significant known and potential risks and benefits, the extent to which these are unknown, and information regarding  all available alternatives were discussed in detail. Specifically the risk of VTE and secondary infections were discussed in detail with the patient and/or HCPOA. They consent to proceed with treatment. Patient was started on same on 12/30.   The treatment plan and use of medications and known side effects were discussed with patient/family. Some of the medications used are based on case reports/anecdotal data.  All other medications being used in the management of COVID-19 based on limited study data.  Complete risks and long-term side effects are unknown, however in the best clinical judgment they seem to be of some benefit.  Patient/family wanted to proceed with treatment options provided.  Mild transaminitis Most likely secondary to COVID-19.  Improving  Elevated blood pressure on presentation No documented history of hypertension. Does not take any antihypertensives at home. Could be due to his anxiety/stress from acute illness Blood pressure has improved , continue to monitor for now.  Dehydration with hyponatremia/hyperkalemia on presentation Creatinine noted to be 1.35 on presentation, he received hydration initially, creatinine improved to 1.02 today.  Sodium 131, today 135 His oral intake has greatly improved , consumed 100% of his meal today he appears slightly volume overloaded, will give 1 dose of Lasix, hopefully will help with dyspnea and weaning oxygen  Mild hyperkalemia on presentation Potassium 5.4 on presentation, normalized after 1 dose of Lokelma  Impaired fasting blood glucose No prior history of diabetes Likely from stress and steroid will check A1c Monitor    Depression Continue citalopram  Other medication: On acyclovir chronically for herpes suppression  Obesity Estimated body mass index is 32.1 kg/m as calculated from the following:   Height as of this encounter: 6\' 2"  (1.88 m).   Weight as of this encounter: 113.4 kg.   DVT Prophylaxis: Lovenox Code  Status: Full code Family Communication:  wife over the phone daily Disposition Plan: Hopefully return home 1-2 days , last dose remdesivir on January 2, will need to go home on home O2  Status is: Inpatient  Remains inpatient appropriate because:IV treatments appropriate due to intensity of illness or inability to take PO and Inpatient level of care appropriate due to severity of illness   Dispo: The patient is from: Home              Anticipated d/c is to: Home              Anticipated d/c date is: 24-48hrs, pending respiratory status and o2 requirement              Patient currently is not medically stable to d/c.   Medications:  Scheduled: . vitamin C  500 mg Oral Daily  . baricitinib  4 mg Oral Daily  . citalopram  40 mg Oral Daily  . enoxaparin (LOVENOX) injection  40 mg Subcutaneous Q24H  . famotidine  20 mg Oral Daily  . methylPREDNISolone (SOLU-MEDROL) injection  60 mg Intravenous Q12H  . valACYclovir  1,000 mg Oral Daily   Continuous: . remdesivir 100 mg in NS 100 mL 100 mg (05/25/20 0955)   KGU:RKYHCWCBJ, chlorpheniramine-HYDROcodone, guaiFENesin-dextromethorphan   Objective:  Vital Signs  Vitals:   05/25/20 1523 05/25/20 2111 05/25/20 2115 05/26/20 0509  BP: (!) 141/69 138/90 132/79 124/73  Pulse: 79 75 64 64  Resp: (!) 24 16  17   Temp: 98.3 F (36.8 C) 98.2 F (36.8 C)  98 F (36.7 C)  TempSrc: Oral Oral  Oral  SpO2: 99% 91%  94%  Weight:      Height:        Intake/Output Summary (Last 24 hours) at 05/26/2020 0830 Last data filed at 05/26/2020 0820 Gross per 24 hour  Intake 2521.81 ml  Output 2400 ml  Net 121.81 ml   Filed Weights   05/24/20 0200 05/24/20 1728  Weight: 111 kg 113.4 kg    General appearance: Awake alert.  In no distress Resp: Clear to auscultation bilaterally.  Normal effort Cardio: S1-S2 is normal regular.  No S3-S4.  No rubs murmurs or bruit GI: Abdomen is soft.  Nontender nondistended.  Bowel sounds are present normal.  No  masses organomegaly Extremities:   Full range of motion of lower extremities.  Trace edema lower extremity Neurologic: Alert and oriented x3.  No focal neurological deficits.    Lab Results:  Data Reviewed: I have personally reviewed following labs and imaging studies  CBC: Recent Labs  Lab 05/23/20 2100 05/24/20 0306 05/25/20 0347 05/26/20 0349  WBC 11.0* 11.2* 9.8 12.7*  NEUTROABS 9.0* 9.5* 7.6 10.0*  HGB 12.6* 12.2* 11.1* 11.1*  HCT 35.5* 34.9* 32.3* 32.3*  MCV 90.3 90.9 93.9 92.6  PLT 208 207 263 322    Basic Metabolic Panel: Recent Labs  Lab 05/23/20 2100 05/24/20 0306 05/24/20 1142 05/25/20 0347 05/26/20 0349  NA 131* 131*  --  134* 135  K 4.3 5.4* 4.6 4.9 4.3  CL 95* 96*  --  97* 97*  CO2 27 27  --  28 27  GLUCOSE 142* 152*  --  149* 169*  BUN 16 19  --  28* 27*  CREATININE 1.30* 1.33*  1.35*  --  1.10 1.02  CALCIUM 8.4* 8.3*  --  8.8* 8.7*    GFR: Estimated Creatinine Clearance: 116.1 mL/min (by C-G formula based on SCr of 1.02 mg/dL).  Liver Function Tests: Recent Labs  Lab 05/23/20 2100 05/24/20 0306 05/25/20 0347 05/26/20 0349  AST 84* 77* 68* 59*  ALT 101* 98* 84* 82*  ALKPHOS 113 117 112 105  BILITOT 1.5* 1.0 0.7 0.7  PROT 7.5 7.1 6.7 6.5  ALBUMIN 3.7 3.4* 3.3* 3.2*    No results for input(s): LIPASE, AMYLASE in the last 168 hours. No results for input(s): AMMONIA in the last 168 hours.  Coagulation Profile: No results for input(s): INR, PROTIME in the last 168 hours.  Cardiac Enzymes: No results for input(s): CKTOTAL, CKMB, CKMBINDEX, TROPONINI in the last 168 hours.  BNP (last 3 results) No results for input(s): PROBNP in the last 8760 hours.  HbA1C: No results for input(s): HGBA1C in the last 72 hours.  CBG: No results for input(s): GLUCAP in the last 168 hours.  Lipid Profile: Recent Labs    05/23/20 2100  TRIG 183*    Thyroid Function Tests: No results for input(s): TSH, T4TOTAL, FREET4, T3FREE, THYROIDAB in the  last 72 hours.  Anemia Panel: Recent Labs    05/23/20 2100 05/24/20 0459  FERRITIN 2,634* 2,519*  Recent Results (from the past 240 hour(s))  Blood Culture (routine x 2)     Status: None (Preliminary result)   Collection Time: 05/23/20  9:00 PM   Specimen: BLOOD  Result Value Ref Range Status   Specimen Description   Final    BLOOD RIGHT ANTECUBITAL Performed at [facility], [address], [place], [place] [address]    Special Requests   Final    BOTTLES DRAWN AEROBIC AND ANAEROBIC Blood Culture adequate volume Performed at [facility], [address] [place], [place] [address]    Culture   Final    NO GROWTH 1 DAY Performed at [facility], [address] [place], [place] [address]    Report Status PENDING  Incomplete  Blood Culture (routine x 2)     Status: None (Preliminary result)   Collection Time: 05/23/20  9:10 PM   Specimen: BLOOD  Result Value Ref Range Status   Specimen Description   Final    BLOOD LEFT ARM Performed at [facility], [address] [place], [place] [address]    Special Requests   Final    BOTTLES DRAWN AEROBIC AND ANAEROBIC Blood Culture adequate volume Performed at [facility], [address], [place], [place] [address]    Culture   Final    NO GROWTH 1 DAY Performed at [facility], [address], [place], [place] [address]    Report Status PENDING  Incomplete  Resp Panel by RT-PCR (Flu A&B, Covid) Nasopharyngeal Swab     Status: Abnormal   Collection Time: 05/23/20  9:44 PM   Specimen: Nasopharyngeal Swab; Nasopharyngeal(NP) swabs in vial transport medium  Result Value Ref Range Status   SARS Coronavirus 2 by RT PCR POSITIVE (A) NEGATIVE Final    Comment: RESULT CALLED TO, READ BACK BY AND VERIFIED WITH: J. NASH 12.29.21 @ 2305 BY MECIAL J. (NOTE) SARS-CoV-2 target nucleic acids are DETECTED.  The SARS-CoV-2 RNA is generally detectable  in upper respiratory specimens during the acute phase of infection. Positive results are indicative of the presence of the identified virus, but do not rule out bacterial infection or co-infection with other pathogens not detected by the test. Clinical correlation with patient history and other diagnostic information is necessary to determine patient infection status. The expected result is Negative.  Fact Sheet for Patients: BloggerCourse.com  Fact Sheet for Healthcare Providers: SeriousBroker.it  This test is not yet approved or cleared by the [place] FDA and  has been authorized for detection and/or diagnosis of SARS-CoV-2 by FDA under an Emergency Use Authorization (EUA).  This EUA will remain in effect (meaning this test can  be used) for the duration of  the COVID-19 declaration under Section 564(b)(1) of the Act, 21 U.S.C. section 360bbb-3(b)(1), unless the authorization is terminated or revoked sooner.     Influenza A by PCR NEGATIVE NEGATIVE Final   Influenza B by PCR NEGATIVE NEGATIVE Final    Comment: (NOTE) The Xpert Xpress SARS-CoV-2/FLU/RSV plus assay is intended as an aid in the diagnosis of influenza from Nasopharyngeal swab specimens and should not be used as a sole basis for treatment. Nasal washings and aspirates are unacceptable for Xpert Xpress SARS-CoV-2/FLU/RSV testing.  Fact Sheet for Patients: BloggerCourse.com  Fact Sheet for Healthcare Providers: SeriousBroker.it  This test is not yet approved or cleared by the [place] FDA and has been authorized for detection and/or diagnosis of SARS-CoV-2 by FDA under  an Emergency Use Authorization (EUA). This EUA will remain in effect (meaning this test can be used) for the duration of the COVID-19 declaration under Section 564(b)(1) of the Act, 21 U.S.C. section 360bbb-3(b)(1), unless the authorization  is terminated or revoked.  Performed at [facility], [address] [place], [place] [address]       Radiology Studies: No results found.     LOS: 3 days   Albertine Grates MD PhD FACP  [facility] Pager on [email or web address]  05/26/2020, 8:30 AM

## 2020-05-27 LAB — COMPREHENSIVE METABOLIC PANEL
ALT: 190 U/L — ABNORMAL HIGH (ref 0–44)
AST: 93 U/L — ABNORMAL HIGH (ref 15–41)
Albumin: 3.1 g/dL — ABNORMAL LOW (ref 3.5–5.0)
Alkaline Phosphatase: 123 U/L (ref 38–126)
Anion gap: 10 (ref 5–15)
BUN: 28 mg/dL — ABNORMAL HIGH (ref 6–20)
CO2: 28 mmol/L (ref 22–32)
Calcium: 8.5 mg/dL — ABNORMAL LOW (ref 8.9–10.3)
Chloride: 98 mmol/L (ref 98–111)
Creatinine, Ser: 0.99 mg/dL (ref 0.61–1.24)
GFR, Estimated: >60 mL/min (ref >60)
Glucose, Bld: 180 mg/dL — ABNORMAL HIGH (ref 70–99)
Potassium: 4.3 mmol/L (ref 3.5–5.1)
Sodium: 136 mmol/L (ref 135–145)
Total Bilirubin: 0.8 mg/dL (ref 0.3–1.2)
Total Protein: 6.3 g/dL — ABNORMAL LOW (ref 6.5–8.1)

## 2020-05-27 LAB — HEMOGLOBIN A1C
Hgb A1c MFr Bld: 4.7 % — ABNORMAL LOW (ref 4.8–5.6)
Mean Plasma Glucose: 88.19 mg/dL

## 2020-05-27 LAB — CBC WITH DIFFERENTIAL/PLATELET
Abs Immature Granulocytes: 0.25 10*3/uL — ABNORMAL HIGH (ref 0.00–0.07)
Basophils Absolute: 0 10*3/uL (ref 0.0–0.1)
Basophils Relative: 0 %
Eosinophils Absolute: 0 10*3/uL (ref 0.0–0.5)
Eosinophils Relative: 0 %
HCT: 33.1 % — ABNORMAL LOW (ref 39.0–52.0)
Hemoglobin: 11.3 g/dL — ABNORMAL LOW (ref 13.0–17.0)
Immature Granulocytes: 2 %
Lymphocytes Relative: 13 %
Lymphs Abs: 1.6 10*3/uL (ref 0.7–4.0)
MCH: 31.7 pg (ref 26.0–34.0)
MCHC: 34.1 g/dL (ref 30.0–36.0)
MCV: 93 fL (ref 80.0–100.0)
Monocytes Absolute: 0.6 10*3/uL (ref 0.1–1.0)
Monocytes Relative: 5 %
Neutro Abs: 9.8 10*3/uL — ABNORMAL HIGH (ref 1.7–7.7)
Neutrophils Relative %: 80 %
Platelets: 344 10*3/uL (ref 150–400)
RBC: 3.56 MIL/uL — ABNORMAL LOW (ref 4.22–5.81)
RDW: 14.4 % (ref 11.5–15.5)
WBC: 12.2 10*3/uL — ABNORMAL HIGH (ref 4.0–10.5)
nRBC: 0.9 % — ABNORMAL HIGH (ref 0.0–0.2)

## 2020-05-27 LAB — D-DIMER, QUANTITATIVE: D-Dimer, Quant: 0.48 ug/mL-FEU (ref 0.00–0.50)

## 2020-05-27 LAB — C-REACTIVE PROTEIN: CRP: 2.8 mg/dL — ABNORMAL HIGH (ref <1.0)

## 2020-05-27 MED ORDER — HYDROCOD POLST-CPM POLST ER 10-8 MG/5ML PO SUER: 5.0000 mL | Freq: Two times a day (BID) | ORAL | 0 refills | Status: AC | PRN

## 2020-05-27 MED ORDER — PREDNISONE 10 MG PO TABS: ORAL_TABLET | ORAL | 0 refills | Status: AC

## 2020-05-27 NOTE — TOC Initial Note (Signed)
Transition of Care [facility]) - Initial/Assessment Note    Patient Details  Name: Kevin West MRN: [id number] Date of Birth: 07-17-1970  Transition of Care (TOC) CM/SW Contact:    Armanda Heritage, RN Phone Number: 05/27/2020, 12:34 PM  Clinical Narrative:                 Referral for home oxygen given to [facility] with [employer].  Expected Discharge Plan: Home/Self Care Barriers to Discharge: No Barriers Identified   Patient Goals and CMS Choice Patient states their goals for this hospitalization and ongoing recovery are:: to go home      Expected Discharge Plan and Services Expected Discharge Plan: Home/Self Care   Discharge Planning Services: CM Consult   Living arrangements for the past 2 months: Single Family Home Expected Discharge Date: 05/27/20               DME Arranged: Oxygen DME Agency: Other - Comment ([employer]) Date DME Agency Contacted: 05/27/20 Time DME Agency Contacted: [phone] Representative spoke with at DME Agency: Vaughan Basta            Prior Living Arrangements/Services Living arrangements for the past 2 months: Single Family Home   Patient language and need for interpreter reviewed:: Yes Do you feel safe going back to the place where you live?: Yes      Need for Family Participation in Patient Care: Yes (Comment) Care giver support system in place?: Yes (comment)   Criminal Activity/Legal Involvement Pertinent to Current Situation/Hospitalization: No - Comment as needed  Activities of Daily Living Home Assistive Devices/Equipment: None ADL Screening (condition at time of admission) Patient's cognitive ability adequate to safely complete daily activities?: Yes Is the patient deaf or have difficulty hearing?: No Does the patient have difficulty seeing, even when wearing glasses/contacts?: No Does the patient have difficulty concentrating, remembering, or making decisions?: No Patient able to express need for assistance with ADLs?: Yes Does the  patient have difficulty dressing or bathing?: No Independently performs ADLs?: Yes (appropriate for developmental age) Does the patient have difficulty walking or climbing stairs?: Yes (secondary to shortness of breath) Weakness of Legs: Both Weakness of Arms/Hands: None  Permission Sought/Granted                  Emotional Assessment Appearance:: Appears stated age Attitude/Demeanor/Rapport: Engaged Affect (typically observed): Accepting Orientation: : Oriented to Self,Oriented to Place,Oriented to  Time,Oriented to Situation   Psych Involvement: No (comment)  Admission diagnosis:  Acute respiratory failure with hypoxia (HCC) [J96.01] Pneumonia due to COVID-19 virus [U07.1, J12.82] COVID-19 [U07.1] Patient Active Problem List   Diagnosis Date Noted  . Pneumonia due to COVID-19 virus 05/23/2020  . Hypoxia 05/23/2020  . BMI 31.0-31.9,adult 12/04/2015  . Hemorrhoids 02/12/2015  . Diarrhea 02/12/2015  . Sinus congestion 10/11/2013  . 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 Anxiety and depression 09/16/2011   PCP:  Porfirio Oar, PA Pharmacy:   [facility] [address]), [place] - [address] [place] ([place]) [place] [address] Phone: [phone] Fax: [phone]  [facility] [phone] [place], [place] - [address] [place] [address] Phone: [phone] Fax: [phone]     Social Determinants of Health (SDOH) Interventions    Readmission Risk Interventions No flowsheet data found.

## 2020-05-27 NOTE — Discharge Summary (Signed)
Physician Discharge Summary  Kevin West GNO:[id number] DOB: 11/28/1970 DOA: 05/23/2020  PCP: Porfirio Oar, PA  Admit date: 05/23/2020 Discharge date: 05/27/2020  Admitted From: Home Discharge disposition: Home with home oxygen   Code Status: Full Code  Diet Recommendation: Regular diet  Discharge Diagnosis:   Principal Problem:   Pneumonia due to COVID-19 virus Active Problems:   Hypoxia   History of Present Illness / Brief narrative:  Patient is a 50 year old male with history of depression, unvaccinated COVID-19.   Patient presented to the ED on 12/29 with complaint of shortness of breath for about a week.   COVID-19 test done at home a week ago was positive.  He also had some diarrhea, abdominal cramping In the ED and he was noted to be hypoxic. Chest x-ray showed pneumonia consistent with COVID-19. He was hospitalized for further management.  Subjective:  Seen and examined this morning.  Pleasant middle-aged Caucasian male.  Propped up in bed.  On 3-1/2 l oxygen by nasal cannula.  Ambulated with 5 L oxygen.  Wants to go home  Hospital Course:   COVID pneumonia Acute respiratory failure with hypoxia  -Presented with positive Covid test at home, worsening shortness of breath -Chest imaging: Chest x-ray suggested pneumonia consistent with Covid  -Treatment: Completed 5-day course of remdesivir, currently on Solu-Medrol 60 mg twice daily. -Progression: Feels better than at presentation but continues to remain oxygen dependent, able to ambulate on 5 L oxygen.  Discharged with tapering course of prednisone and supplemental oxygen. -Encouraged incentive spirometry, prone position, out of bed and early mobilization as much as possible -Continue airborne/contact isolation precautions for duration of 3 weeks from the day of diagnosis. -WBC and inflammatory markers trend as below.  Overall improving trend. Recent Labs  Lab 05/23/20 2100 05/23/20 2144 05/24/20 0306  05/24/20 0459 05/25/20 0347 05/26/20 0349 05/27/20 0330  SARSCOV2NAA  --  POSITIVE*  --   --   --   --   --   WBC 11.0*  --  11.2*  --  9.8 12.7* 12.2*  LATICACIDVEN 1.1  --   --   --   --   --   --   PROCALCITON 0.62  --   --   --   --   --   --   DDIMER 0.66*  --   --   --  0.52* 0.27 0.48  FERRITIN 2,634*  --   --  2,519*  --   --   --   LDH 326*  --   --   --   --   --   --   CRP 23.8*  --   --  23.9* 14.6* 5.8* 2.8*  ALT 101*  --  98*  --  84* 82* 190*   The treatment plan and use of medications and known side effects were discussed with patient/family. Some of the medications used are based on case reports/anecdotal data.  All other medications being used in the management of COVID-19 based on limited study data.  Complete risks and long-term side effects are unknown, however in the best clinical judgment they seem to be of some benefit.  Patient wanted to proceed with treatment options provided.  Acute kidney injury -Creatinine improved back to baseline with IV fluid. Recent Labs    05/23/20 2100 05/24/20 0306 05/25/20 0347 05/26/20 0349 05/27/20 0330  BUN 16 19 28* 27* 28*  CREATININE 1.30* 1.33*  1.35* 1.10 1.02 0.99   Mild transaminitis Most likely  secondary to COVID-19.  Improving Recent Labs  Lab 05/23/20 2100 05/24/20 0306 05/25/20 0347 05/26/20 0349 05/27/20 0330  AST 84* 77* 68* 59* 93*  ALT 101* 98* 84* 82* 190*  ALKPHOS 113 117 112 105 123  BILITOT 1.5* 1.0 0.7 0.7 0.8  PROT 7.5 7.1 6.7 6.5 6.3*  ALBUMIN 3.7 3.4* 3.3* 3.2* 3.1*   Impaired fasting blood glucose No prior history of diabetes A1c 4.7. Likely from stress and steroid Blood sugar level running in less than 200 consistently  Depression Continue citalopram  Chronic herpes  -acyclovir chronically  Wound care:    Discharge Exam:   Vitals:   05/26/20 0509 05/26/20 1355 05/26/20 2008 05/27/20 0525  BP: 124/73 125/83 137/77 136/87  Pulse: 64 72 60 (!) 59  Resp: 17 17 19 18    Temp: 98 F (36.7 C) 97.9 F (36.6 C) 98.2 F (36.8 C) 97.9 F (36.6 C)  TempSrc: Oral Oral Oral Oral  SpO2: 94% 94% 95% 95%  Weight:      Height:        Body mass index is 32.1 kg/m.  General exam: Pleasant, middle-aged Caucasian male.  Propped up in bed.  Not in distress.  Feels better than at presentation. Skin: No rashes, lesions or ulcers. HEENT: Atraumatic, normocephalic, no obvious bleeding Lungs: Clear to auscultation bilaterally CVS: Regular rate and rhythm, no murmur GI/Abd soft, nontender, nondistended, bowel sound present CNS: Alert, awake, oriented x3 Psychiatry: Mood appropriate Extremities: No pedal edema, no calf tenderness  Follow ups:   Discharge Instructions    Diet - low sodium heart healthy   Complete by: As directed    Increase activity slowly   Complete by: As directed       Follow-up Information    Jeffery, Chelle, PA Follow up in 1 week(s).   Specialty: Family Medicine Why: Hospital discharge follow-up, PCP to follow-up on home oxygen need, repeat lab work at hospital discharge follow-up Contact information: [place] [address] [phone]               Recommendations for Outpatient Follow-Up:   1. Follow-up with PCP as an outpatient  Discharge Instructions:  Follow with Primary MD Harrison Mons, PA in 7 days   Get CBC/BMP checked in next visit within 1 week by PCP or SNF MD ( we routinely change or add medications that can affect your baseline labs and fluid status, therefore we recommend that you get the mentioned basic workup next visit with your PCP, your PCP may decide not to get them or add new tests based on their clinical decision)  On your next visit with your PCP, please Get Medicines reviewed and adjusted.  Please request your PCP  to go over all Hospital Tests and Procedure/Radiological results at the follow up, please get all Hospital records sent to your Prim MD by signing  hospital release before you go home.  Activity: As tolerated with Full fall precautions use walker/cane & assistance as needed  For Heart failure patients - Check your Weight same time everyday, if you gain over 2 pounds, or you develop in leg swelling, experience more shortness of breath or chest pain, call your Primary MD immediately. Follow Cardiac Low Salt Diet and 1.5 lit/day fluid restriction.  If you have smoked or chewed Tobacco in the last 2 yrs please stop smoking, stop any regular Alcohol  and or any Recreational drug use.  If you experience worsening of your admission symptoms,  develop shortness of breath, life threatening emergency, suicidal or homicidal thoughts you must seek medical attention immediately by calling 911 or calling your MD immediately  if symptoms less severe.  You Must read complete instructions/literature along with all the possible adverse reactions/side effects for all the Medicines you take and that have been prescribed to you. Take any new Medicines after you have completely understood and accpet all the possible adverse reactions/side effects.   Do not drive, operate heavy machinery, perform activities at heights, swimming or participation in water activities or provide baby sitting services if your were admitted for syncope or siezures until you have seen by Primary MD or a Neurologist and advised to do so again.  Do not drive when taking Pain medications.  Do not take more than prescribed Pain, Sleep and Anxiety Medications  Wear Seat belts while driving.   Please note You were cared for by a hospitalist during your hospital stay. If you have any questions about your discharge medications or the care you received while you were in the hospital after you are discharged, you can call the unit and asked to speak with the hospitalist on call if the hospitalist that took care of you is not available. Once you are discharged, your primary care physician will  handle any further medical issues. Please note that NO REFILLS for any discharge medications will be authorized once you are discharged, as it is imperative that you return to your primary care physician (or establish a relationship with a primary care physician if you do not have one) for your aftercare needs so that they can reassess your need for medications and monitor your lab values.    Allergies as of 05/27/2020      Reactions   Milk-related Compounds Diarrhea   Peanut-containing Drug Products Other (See Comments)   Triggers HSV outbreak      Medication List    TAKE these medications   albuterol 108 (90 Base) MCG/ACT inhaler Commonly known as: VENTOLIN HFA Inhale 2 puffs into the lungs every 4 (four) hours as needed for wheezing or shortness of breath (cough, shortness of breath or wheezing.).   chlorpheniramine-HYDROcodone 10-8 MG/5ML Suer Commonly known as: TUSSIONEX Take 5 mLs by mouth every 12 (twelve) hours as needed for cough.   citalopram 40 MG tablet Commonly known as: CELEXA Take 40 mg by mouth daily.   meloxicam 15 MG tablet Commonly known as: MOBIC Take 1 tablet (15 mg total) by mouth daily.   predniSONE 10 MG tablet Commonly known as: DELTASONE 4 tabs twice daily X 3 days -->4 tabs daily X 3 days-->3 tabs daily X 3 days--> 2 tabs daily X 3 days--> 1 tab daily X 3 days, then STOP.   valACYclovir 1000 MG tablet Commonly known as: VALTREX Take 1,000 mg by mouth daily.            Durable Medical Equipment  (From admission, onward)         Start     Ordered   05/27/20 1146  For home use only DME oxygen  Once       Question Answer Comment  Length of Need Lifetime   Mode or (Route) Nasal cannula   Liters per Minute 3   Frequency Continuous (stationary and portable oxygen unit needed)   Oxygen conserving device Yes   Oxygen delivery system Gas      05/27/20 1145          Time coordinating discharge: 35 minutes  The results of significant  diagnostics from this hospitalization (including imaging, microbiology, ancillary and laboratory) are listed below for reference.    Procedures and Diagnostic Studies:   CT Angio Chest PE W and/or Wo Contrast  Result Date: 05/24/2020 CLINICAL DATA:  Chest pain and shortness of breath. Suspected COVID-19. EXAM: CT ANGIOGRAPHY CHEST WITH CONTRAST TECHNIQUE: Multidetector CT imaging of the chest was performed using the standard protocol during bolus administration of intravenous contrast. Multiplanar CT image reconstructions and MIPs were obtained to evaluate the vascular anatomy. CONTRAST:  OMNIPAQUE IOHEXOL 350 MG/ML SOLN COMPARISON:  None. FINDINGS: Cardiovascular: Contrast injection is sufficient to demonstrate satisfactory opacification of the pulmonary arteries to the segmental level. There is no pulmonary embolus or evidence of right heart strain. The size of the main pulmonary artery is normal. Heart size is normal, with no pericardial effusion. The course and caliber of the aorta are normal. There is no atherosclerotic calcification. Opacification decreased due to pulmonary arterial phase contrast bolus timing. Mediastinum/Nodes: No mediastinal, hilar or axillary lymphadenopathy. Normal visualized thyroid. Thoracic esophageal course is normal. Lungs/Pleura: Multifocal, peripheral predominant ground glass opacities. No pleural effusion. Upper Abdomen: Contrast bolus timing is not optimized for evaluation of the abdominal organs. The visualized portions of the organs of the upper abdomen are normal. Musculoskeletal: No chest wall abnormality. No bony spinal canal stenosis. Review of the MIP images confirms the above findings. IMPRESSION: 1. No pulmonary embolus or acute aortic syndrome. 2. COVID-19 pattern pneumoniae. Electronically Signed   By: Deatra Robinson M.D.   On: 05/24/2020 01:39   DG Chest Port 1 View  Result Date: 05/23/2020 CLINICAL DATA:  COVID pneumonia short of breath EXAM:  PORTABLE CHEST 1 VIEW COMPARISON:  02/01/2011 FINDINGS: Diffuse bilateral streaky and ground-glass opacity left greater than right. No pleural effusion. Normal cardiomediastinal silhouette. No pneumothorax IMPRESSION: Diffuse left greater than right bilateral streaky and ground-glass opacity, consistent with pneumonia Electronically Signed   By: Jasmine Pang M.D.   On: 05/23/2020 20:52     Labs:   Basic Metabolic Panel: Recent Labs  Lab 05/23/20 2100 05/24/20 0306 05/24/20 1142 05/25/20 0347 05/26/20 0349 05/27/20 0330  NA 131* 131*  --  134* 135 136  K 4.3 5.4*   < > 4.9 4.3 4.3  CL 95* 96*  --  97* 97* 98  CO2 27 27  --  28 27 28   GLUCOSE 142* 152*  --  149* 169* 180*  BUN 16 19  --  28* 27* 28*  CREATININE 1.30* 1.33*  1.35*  --  1.10 1.02 0.99  CALCIUM 8.4* 8.3*  --  8.8* 8.7* 8.5*   < > = values in this interval not displayed.   GFR Estimated Creatinine Clearance: 119.6 mL/min (by C-G formula based on SCr of 0.99 mg/dL). Liver Function Tests: Recent Labs  Lab 05/23/20 2100 05/24/20 0306 05/25/20 0347 05/26/20 0349 05/27/20 0330  AST 84* 77* 68* 59* 93*  ALT 101* 98* 84* 82* 190*  ALKPHOS 113 117 112 105 123  BILITOT 1.5* 1.0 0.7 0.7 0.8  PROT 7.5 7.1 6.7 6.5 6.3*  ALBUMIN 3.7 3.4* 3.3* 3.2* 3.1*   No results for input(s): LIPASE, AMYLASE in the last 168 hours. No results for input(s): AMMONIA in the last 168 hours. Coagulation profile No results for input(s): INR, PROTIME in the last 168 hours.  CBC: Recent Labs  Lab 05/23/20 2100 05/24/20 0306 05/25/20 0347 05/26/20 0349 05/27/20 0330  WBC 11.0* 11.2* 9.8 12.7* 12.2*  NEUTROABS  9.0* 9.5* 7.6 10.0* 9.8*  HGB 12.6* 12.2* 11.1* 11.1* 11.3*  HCT 35.5* 34.9* 32.3* 32.3* 33.1*  MCV 90.3 90.9 93.9 92.6 93.0  PLT 208 207 263 322 344   Cardiac Enzymes: No results for input(s): CKTOTAL, CKMB, CKMBINDEX, TROPONINI in the last 168 hours. BNP: Invalid input(s): POCBNP CBG: No results for input(s): GLUCAP  in the last 168 hours. D-Dimer Recent Labs    05/26/20 0349 05/27/20 0330  DDIMER 0.27 0.48   Hgb A1c Recent Labs    05/27/20 0330  HGBA1C 4.7*   Lipid Profile No results for input(s): CHOL, HDL, LDLCALC, TRIG, CHOLHDL, LDLDIRECT in the last 72 hours. Thyroid function studies No results for input(s): TSH, T4TOTAL, T3FREE, THYROIDAB in the last 72 hours.  Invalid input(s): FREET3 Anemia work up No results for input(s): VITAMINB12, FOLATE, FERRITIN, TIBC, IRON, RETICCTPCT in the last 72 hours. Microbiology Recent Results (from the past 240 hour(s))  Blood Culture (routine x 2)     Status: None (Preliminary result)   Collection Time: 05/23/20  9:00 PM   Specimen: BLOOD  Result Value Ref Range Status   Specimen Description   Final    BLOOD RIGHT ANTECUBITAL Performed at [facility], [address] [place], [place] [address]    Special Requests   Final    BOTTLES DRAWN AEROBIC AND ANAEROBIC Blood Culture adequate volume Performed at [facility], [address] [place], [place] [address]    Culture   Final    NO GROWTH 3 DAYS Performed at [facility], [address] [place], [place] [address]    Report Status PENDING  Incomplete  Blood Culture (routine x 2)     Status: None (Preliminary result)   Collection Time: 05/23/20  9:10 PM   Specimen: BLOOD  Result Value Ref Range Status   Specimen Description   Final    BLOOD LEFT ARM Performed at [facility], [address] [place], [place] [address]    Special Requests   Final    BOTTLES DRAWN AEROBIC AND ANAEROBIC Blood Culture adequate volume Performed at [facility], [address] [place], [place] [address]    Culture   Final    NO GROWTH 3 DAYS Performed at [facility], [address] [place], [place] [address]    Report Status PENDING  Incomplete  Resp Panel by RT-PCR (Flu A&B, Covid) Nasopharyngeal Swab     Status:  Abnormal   Collection Time: 05/23/20  9:44 PM   Specimen: Nasopharyngeal Swab; Nasopharyngeal(NP) swabs in vial transport medium  Result Value Ref Range Status   SARS Coronavirus 2 by RT PCR POSITIVE (A) NEGATIVE Final    Comment: RESULT CALLED TO, READ BACK BY AND VERIFIED WITH: J. NASH 12.29.21 @ 2305 BY MECIAL J. (NOTE) SARS-CoV-2 target nucleic acids are DETECTED.  The SARS-CoV-2 RNA is generally detectable in upper respiratory specimens during the acute phase of infection. Positive results are indicative of the presence of the identified virus, but do not rule out bacterial infection or co-infection with other pathogens not detected by the test. Clinical correlation with patient history and other diagnostic information is necessary to determine patient infection status. The expected result is Negative.  Fact Sheet for Patients: BloggerCourse.comhttps://www.fda.gov/media/152166/download  Fact Sheet for Healthcare Providers: SeriousBroker.ithttps://www.fda.gov/media/152162/download  This test is not yet approved or cleared by the [place] FDA and  has been authorized for detection and/or diagnosis of SARS-CoV-2 by FDA under  an Emergency Use Authorization (EUA).  This EUA will remain in effect (meaning this test can  be used) for the duration of  the COVID-19 declaration under Section 564(b)(1) of the Act, 21 U.S.C. section 360bbb-3(b)(1), unless the authorization is terminated or revoked sooner.     Influenza A by PCR NEGATIVE NEGATIVE Final   Influenza B by PCR NEGATIVE NEGATIVE Final    Comment: (NOTE) The Xpert Xpress SARS-CoV-2/FLU/RSV plus assay is intended as an aid in the diagnosis of influenza from Nasopharyngeal swab specimens and should not be used as a sole basis for treatment. Nasal washings and aspirates are unacceptable for Xpert Xpress SARS-CoV-2/FLU/RSV testing.  Fact Sheet for Patients: BloggerCourse.com  Fact Sheet for Healthcare  Providers: SeriousBroker.it  This test is not yet approved or cleared by the [place] FDA and has been authorized for detection and/or diagnosis of SARS-CoV-2 by FDA under an Emergency Use Authorization (EUA). This EUA will remain in effect (meaning this test can be used) for the duration of the COVID-19 declaration under Section 564(b)(1) of the Act, 21 U.S.C. section 360bbb-3(b)(1), unless the authorization is terminated or revoked.  Performed at [facility], [address] [place], [place] [address]      Signed: Melina Schools Mckay Tegtmeyer  [facility] 05/27/2020, 12:01 PM

## 2020-05-27 NOTE — Plan of Care (Signed)
  Problem: Education: Goal: Knowledge of General Education information will improve Description: Including pain rating scale, medication(s)/side effects and non-pharmacologic comfort measures Outcome: Completed/Met   Problem: Health Behavior/Discharge Planning: Goal: Ability to manage health-related needs will improve Outcome: Adequate for Discharge   Problem: Clinical Measurements: Goal: Ability to maintain clinical measurements within normal limits will improve Outcome: Adequate for Discharge Goal: Will remain free from infection Outcome: Adequate for Discharge Goal: Diagnostic test results will improve Outcome: Adequate for Discharge Goal: Respiratory complications will improve Outcome: Progressing Goal: Cardiovascular complication will be avoided Outcome: Adequate for Discharge

## 2020-05-27 NOTE — Progress Notes (Signed)
SATURATION QUALIFICATIONS: (This note is used to comply with regulatory documentation for home oxygen)  Patient Saturations on Room Air at Rest = 82  Patient Saturations on Room Air while Ambulating = 77  Patient Saturations on 5Liters of oxygen while Ambulating = 90%  Please briefly explain why patient needs home oxygen:Pt sat rate drops below 90 without O2

## 2020-05-27 NOTE — Plan of Care (Signed)
  Problem: Education: Goal: Knowledge of General Education information will improve Description: Including pain rating scale, medication(s)/side effects and non-pharmacologic comfort measures Outcome: Progressing   Problem: Coping: Goal: Level of anxiety will decrease Outcome: Progressing   Problem: Activity: Goal: Risk for activity intolerance will decrease Outcome: Progressing   

## 2020-05-27 NOTE — Progress Notes (Signed)
Patient was given discharge instructions home today. All questions were answered. Patient was sent home with O2 tank, pt demonstrated proper knowledge of how to use portable O2. Patient was taken to car via wheelchair RN and NT.

## 2020-05-29 LAB — CULTURE, BLOOD (ROUTINE X 2)
Culture: NO GROWTH
Culture: NO GROWTH
Special Requests: ADEQUATE
Special Requests: ADEQUATE

## 2022-02-09 NOTE — Progress Notes (Deleted)
@Patient  ID: Kevin West, male    DOB: 1971/04/12, 51 y.o.   MRN: [id number]  No chief complaint on file.   Referring provider: Harrison Mons, PA  HPI: 51 year old male, never smoked. PMH significant for covid-19 pneumonia, hypoxia, anxiety/depression.  02/10/2022 Patient presents today for sleep consult.  He has symptoms of snoring. No prior sleep studies.      Allergies  Allergen Reactions   Milk-Related Compounds Diarrhea   Peanut-Containing Drug Products Other (See Comments)    Triggers HSV outbreak    Immunization History  Administered Date(s) Administered   Tdap 07/23/2010    Past Medical History:  Diagnosis Date   Allergy    Anxiety    Depression    HSV-2 infection     Tobacco History: Social History   Tobacco Use  Smoking Status Never  Smokeless Tobacco Never   Counseling given: Not Answered   Outpatient Medications Prior to Visit  Medication Sig Dispense Refill   albuterol (PROVENTIL HFA;VENTOLIN HFA) 108 (90 Base) MCG/ACT inhaler Inhale 2 puffs into the lungs every 4 (four) hours as needed for wheezing or shortness of breath (cough, shortness of breath or wheezing.). 1 Inhaler 1   chlorpheniramine-HYDROcodone (TUSSIONEX) 10-8 MG/5ML SUER Take 5 mLs by mouth every 12 (twelve) hours as needed for cough. 140 mL 0   citalopram (CELEXA) 40 MG tablet Take 40 mg by mouth daily.     meloxicam (MOBIC) 15 MG tablet Take 1 tablet (15 mg total) by mouth daily. 30 tablet 1   predniSONE (DELTASONE) 10 MG tablet 4 tabs twice daily X 3 days -->4 tabs daily X 3 days-->3 tabs daily X 3 days--> 2 tabs daily X 3 days--> 1 tab daily X 3 days, then STOP. 60 tablet 0   valACYclovir (VALTREX) 1000 MG tablet Take 1,000 mg by mouth daily.     No facility-administered medications prior to visit.      Review of Systems  Review of Systems   Physical Exam  There were no vitals taken for this visit. Physical Exam   Lab Results:  CBC    Component Value  Date/Time   WBC 12.2 (H) 05/27/2020 0330   RBC 3.56 (L) 05/27/2020 0330   HGB 11.3 (L) 05/27/2020 0330   HGB 15.1 12/03/2016 0849   HCT 33.1 (L) 05/27/2020 0330   HCT 42.8 12/03/2016 0849   PLT 344 05/27/2020 0330   PLT 143 (L) 12/03/2016 0849   MCV 93.0 05/27/2020 0330   MCV 93 12/03/2016 0849   MCH 31.7 05/27/2020 0330   MCHC 34.1 05/27/2020 0330   RDW 14.4 05/27/2020 0330   RDW 14.7 12/03/2016 0849   LYMPHSABS 1.6 05/27/2020 0330   LYMPHSABS 2.5 12/03/2016 0849   MONOABS 0.6 05/27/2020 0330   EOSABS 0.0 05/27/2020 0330   EOSABS 0.2 12/03/2016 0849   BASOSABS 0.0 05/27/2020 0330   BASOSABS 0.1 12/03/2016 0849    BMET    Component Value Date/Time   NA 136 05/27/2020 0330   NA 140 12/03/2016 0849   K 4.3 05/27/2020 0330   CL 98 05/27/2020 0330   CO2 28 05/27/2020 0330   GLUCOSE 180 (H) 05/27/2020 0330   BUN 28 (H) 05/27/2020 0330   BUN 14 12/03/2016 0849   CREATININE 0.99 05/27/2020 0330   CREATININE 1.21 12/04/2015 0956   CALCIUM 8.5 (L) 05/27/2020 0330   GFRNONAA >60 05/27/2020 0330   GFRNONAA 73 09/29/2013 1422   GFRAA 71 12/03/2016 0849   GFRAA 84 09/29/2013  1422    BNP    Component Value Date/Time   BNP 37.7 05/23/2020 2101    ProBNP No results found for: "PROBNP"  Imaging: No results found.   Assessment & Plan:   No problem-specific Assessment & Plan notes found for this encounter.     Martyn Ehrich, NP 02/09/2022

## 2022-02-10 ENCOUNTER — Institutional Professional Consult (permissible substitution): Payer: BC Managed Care – PPO | Admitting: Primary Care

## 2022-03-23 IMAGING — CT CT ANGIO CHEST
3 of 7 series · 18 of 36 positions shown · IV contrast (OMNIPAQUE 350)
Comparison: None.

CLINICAL DATA: Chest pain and shortness of breath. Suspected
ZOER4-QC.

EXAM:
CT ANGIOGRAPHY CHEST WITH CONTRAST
TECHNIQUE: Multidetector CT imaging of the chest was performed using the
standard protocol during bolus administration of intravenous
contrast. Multiplanar CT image reconstructions and MIPs were
obtained to evaluate the vascular anatomy.
CONTRAST:  100mL OMNIPAQUE IOHEXOL 350 MG/ML SOLN

[Series 5: thins · axial · 0.74mm/px · z∈[+1496,+1724]mm · 12 of 271 slices shown]
[im 21/271  lung]
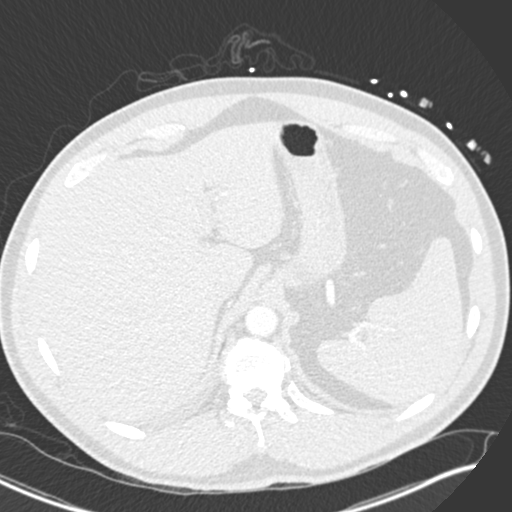
[im 42/271  mediastinal]
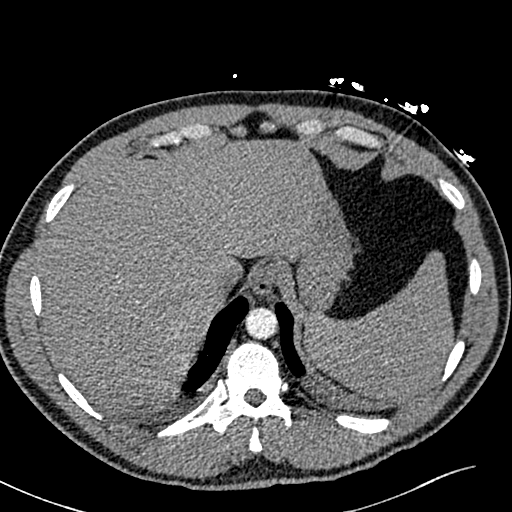
[im 63/271  lung]
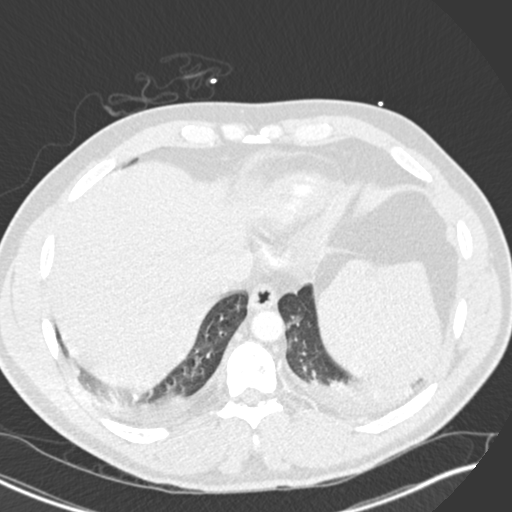
[im 84/271  mediastinal]
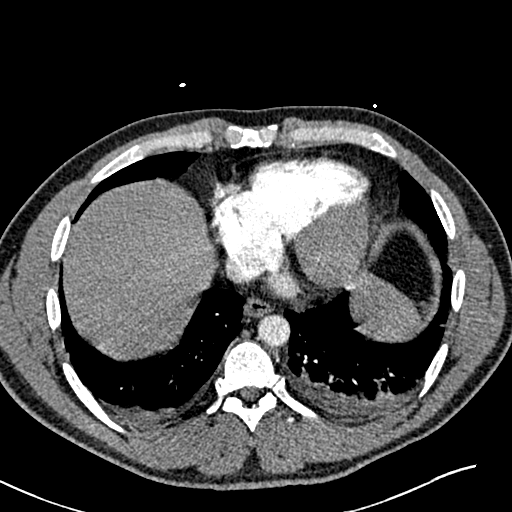
[im 104/271  lung]
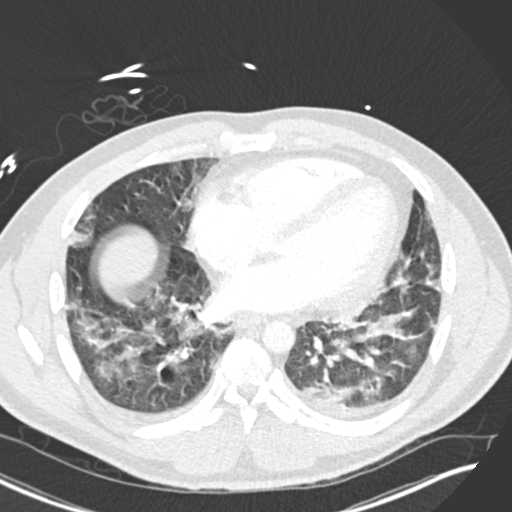
[im 125/271  mediastinal]
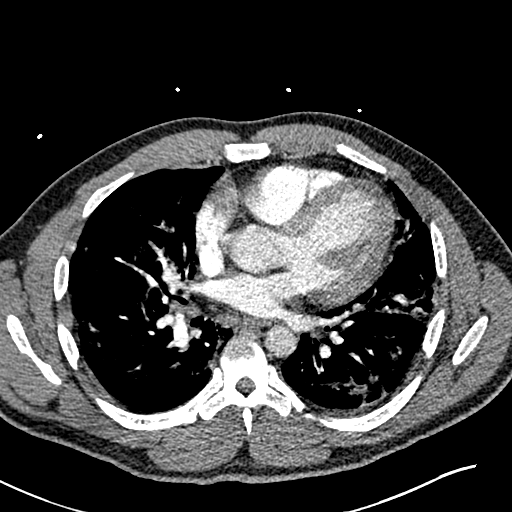
[im 146/271  lung]
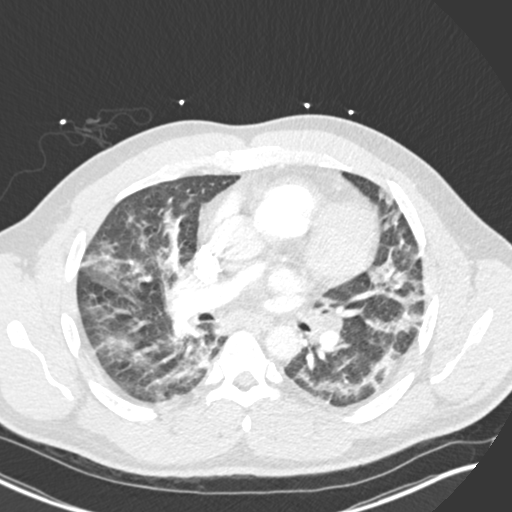
[im 167/271  mediastinal]
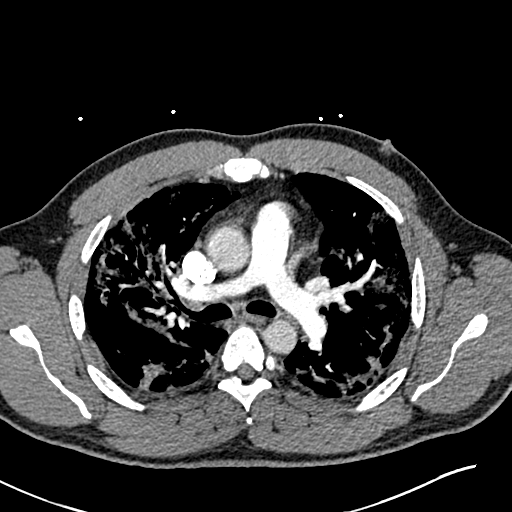
[im 187/271  lung]
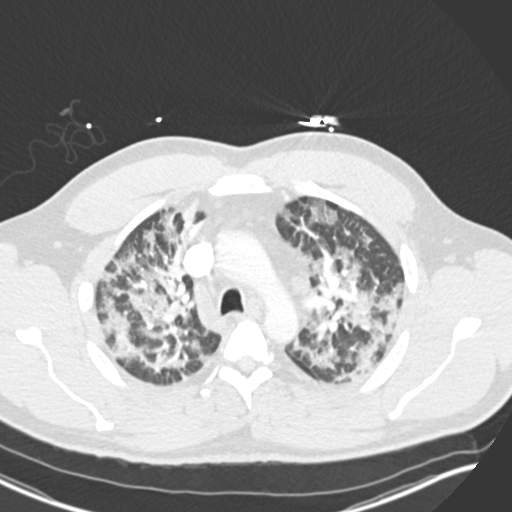
[im 208/271  mediastinal]
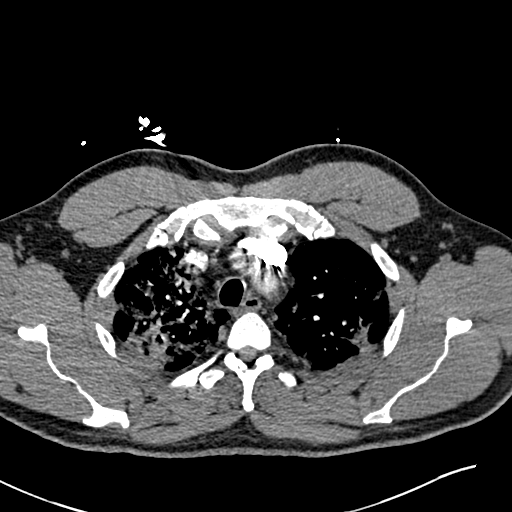
[im 229/271  lung]
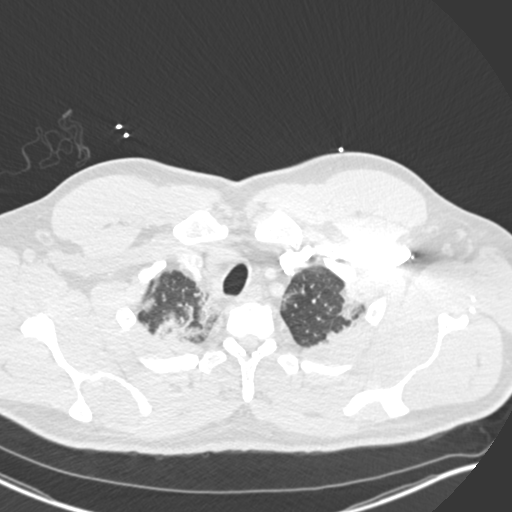
[im 250/271  mediastinal]
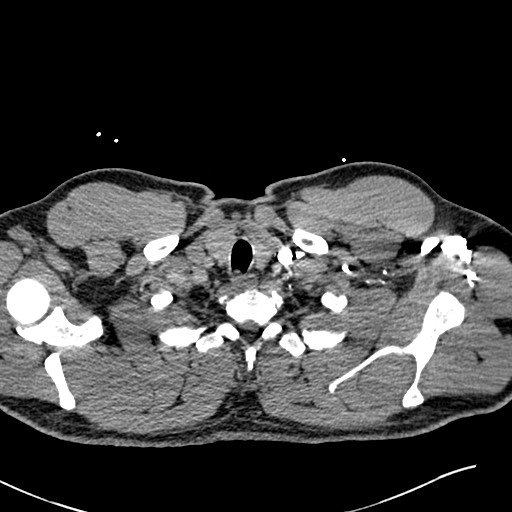

[Series 6: lung · axial · 0.74mm/px · z∈[+1536,+1704]mm · 5 of 126 slices shown]
[im 21/126  mediastinal]
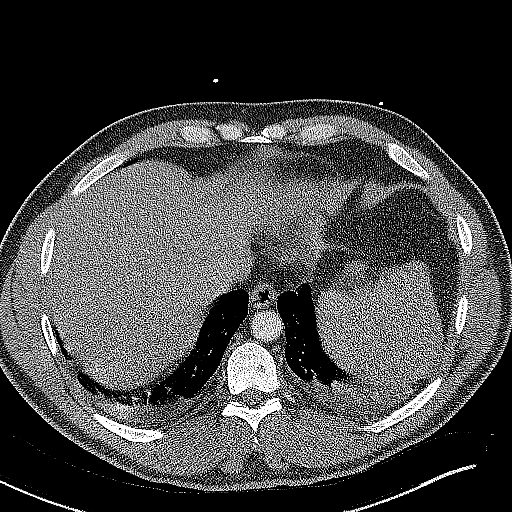
[im 42/126  mediastinal]
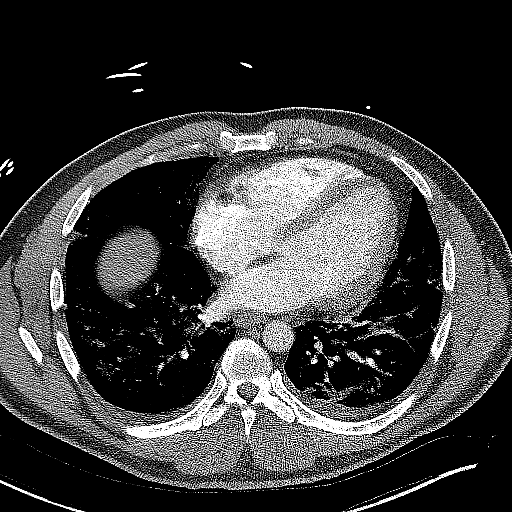
[im 63/126  mediastinal]
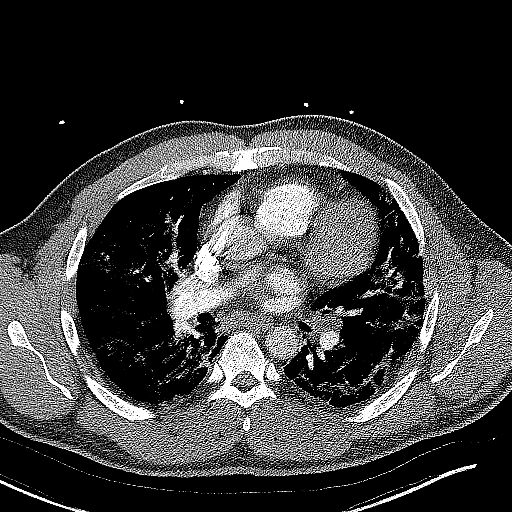
[im 84/126  mediastinal]
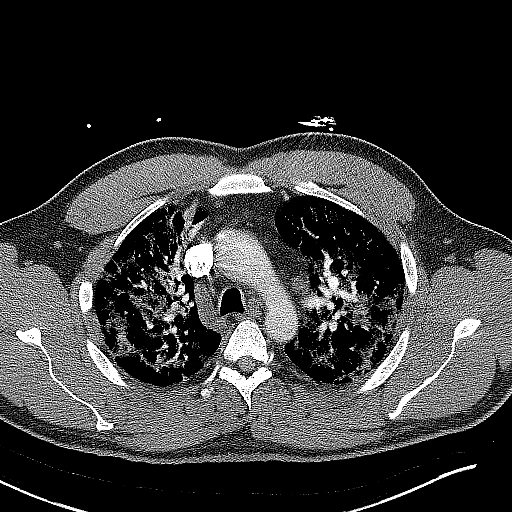
[im 105/126  mediastinal]
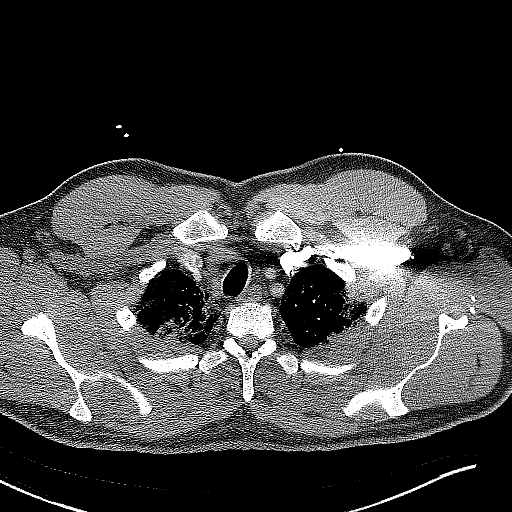

[Series 7: coronal mpr · coronal · 0.54mm/px · 1 of 142 slices shown]
[im 71/142  mediastinal]
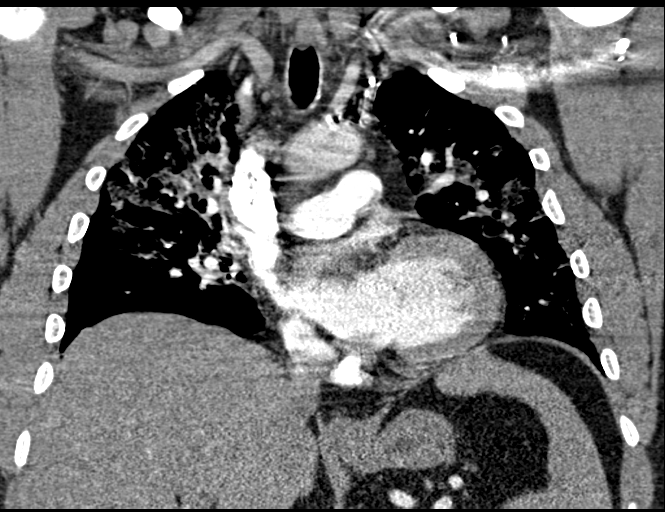

[18 of 36 positions shown; findings below may reference images not displayed]

FINDINGS: Cardiovascular: Contrast injection is sufficient to demonstrate
satisfactory opacification of the pulmonary arteries to the
segmental level. There is no pulmonary embolus or evidence of right
heart strain. The size of the main pulmonary artery is normal. Heart
size is normal, with no pericardial effusion. The course and caliber
of the aorta are normal. There is no atherosclerotic calcification.
Opacification decreased due to pulmonary arterial phase contrast
bolus timing.

Mediastinum/Nodes: No mediastinal, hilar or axillary
lymphadenopathy. Normal visualized thyroid. Thoracic esophageal
course is normal.

Lungs/Pleura: Multifocal, peripheral predominant ground glass
opacities. No pleural effusion.

Upper Abdomen: Contrast bolus timing is not optimized for evaluation
of the abdominal organs. The visualized portions of the organs of
the upper abdomen are normal.

Musculoskeletal: No chest wall abnormality. No bony spinal canal
stenosis.

Review of the MIP images confirms the above findings.
IMPRESSION: 1. No pulmonary embolus or acute aortic syndrome.
2. ZOER4-QC pattern pneumoniae.
